# Patient Record
Sex: Female | Born: 1970 | Race: Black or African American | Hispanic: No | Marital: Single | Smoking: Current every day smoker
Health system: Southern US, Community
[De-identification: ages and names within clinical notes are randomized; demographics above are authoritative.]

## PROBLEM LIST (undated history)

## (undated) DIAGNOSIS — D219 Benign neoplasm of connective and other soft tissue, unspecified: Secondary | ICD-10-CM

## (undated) DIAGNOSIS — Z8619 Personal history of other infectious and parasitic diseases: Secondary | ICD-10-CM

## (undated) DIAGNOSIS — M25569 Pain in unspecified knee: Secondary | ICD-10-CM

## (undated) DIAGNOSIS — F329 Major depressive disorder, single episode, unspecified: Secondary | ICD-10-CM

## (undated) DIAGNOSIS — F1010 Alcohol abuse, uncomplicated: Secondary | ICD-10-CM

## (undated) DIAGNOSIS — R030 Elevated blood-pressure reading, without diagnosis of hypertension: Secondary | ICD-10-CM

## (undated) DIAGNOSIS — F1090 Alcohol use, unspecified, uncomplicated: Secondary | ICD-10-CM

## (undated) DIAGNOSIS — R32 Unspecified urinary incontinence: Secondary | ICD-10-CM

## (undated) DIAGNOSIS — Z7289 Other problems related to lifestyle: Secondary | ICD-10-CM

## (undated) DIAGNOSIS — IMO0001 Reserved for inherently not codable concepts without codable children: Secondary | ICD-10-CM

## (undated) DIAGNOSIS — F32A Depression, unspecified: Secondary | ICD-10-CM

## (undated) DIAGNOSIS — Z789 Other specified health status: Secondary | ICD-10-CM

## (undated) HISTORY — DX: Other problems related to lifestyle: Z72.89

## (undated) HISTORY — DX: Reserved for inherently not codable concepts without codable children: IMO0001

## (undated) HISTORY — DX: Personal history of other infectious and parasitic diseases: Z86.19

## (undated) HISTORY — DX: Depression, unspecified: F32.A

## (undated) HISTORY — DX: Major depressive disorder, single episode, unspecified: F32.9

## (undated) HISTORY — DX: Alcohol use, unspecified, uncomplicated: F10.90

## (undated) HISTORY — DX: Benign neoplasm of connective and other soft tissue, unspecified: D21.9

## (undated) HISTORY — DX: Pain in unspecified knee: M25.569

## (undated) HISTORY — DX: Elevated blood-pressure reading, without diagnosis of hypertension: R03.0

## (undated) HISTORY — DX: Unspecified urinary incontinence: R32

## (undated) HISTORY — DX: Other specified health status: Z78.9

---

## 1997-07-15 ENCOUNTER — Emergency Department (HOSPITAL_COMMUNITY): Admission: EM | Admit: 1997-07-15 | Discharge: 1997-07-15 | Payer: Self-pay | Admitting: Internal Medicine

## 1998-05-16 ENCOUNTER — Emergency Department (HOSPITAL_COMMUNITY): Admission: EM | Admit: 1998-05-16 | Discharge: 1998-05-16 | Payer: Self-pay | Admitting: Emergency Medicine

## 1998-08-01 ENCOUNTER — Emergency Department (HOSPITAL_COMMUNITY): Admission: EM | Admit: 1998-08-01 | Discharge: 1998-08-02 | Payer: Self-pay | Admitting: Emergency Medicine

## 1998-09-20 ENCOUNTER — Emergency Department (HOSPITAL_COMMUNITY): Admission: EM | Admit: 1998-09-20 | Discharge: 1998-09-20 | Payer: Self-pay | Admitting: Emergency Medicine

## 1998-09-20 ENCOUNTER — Encounter: Payer: Self-pay | Admitting: Emergency Medicine

## 2001-03-31 ENCOUNTER — Other Ambulatory Visit: Admission: RE | Admit: 2001-03-31 | Discharge: 2001-03-31 | Payer: Self-pay | Admitting: Obstetrics and Gynecology

## 2003-03-17 ENCOUNTER — Emergency Department (HOSPITAL_COMMUNITY): Admission: EM | Admit: 2003-03-17 | Discharge: 2003-03-17 | Payer: Self-pay | Admitting: Emergency Medicine

## 2006-09-29 ENCOUNTER — Emergency Department (HOSPITAL_COMMUNITY): Admission: EM | Admit: 2006-09-29 | Discharge: 2006-09-29 | Payer: Self-pay | Admitting: Emergency Medicine

## 2007-02-07 ENCOUNTER — Emergency Department (HOSPITAL_COMMUNITY): Admission: EM | Admit: 2007-02-07 | Discharge: 2007-02-07 | Payer: Self-pay | Admitting: Emergency Medicine

## 2007-02-12 ENCOUNTER — Emergency Department (HOSPITAL_COMMUNITY): Admission: EM | Admit: 2007-02-12 | Discharge: 2007-02-12 | Payer: Self-pay | Admitting: Emergency Medicine

## 2007-02-19 ENCOUNTER — Emergency Department (HOSPITAL_COMMUNITY): Admission: EM | Admit: 2007-02-19 | Discharge: 2007-02-19 | Payer: Self-pay | Admitting: Emergency Medicine

## 2008-05-19 ENCOUNTER — Emergency Department (HOSPITAL_COMMUNITY): Admission: EM | Admit: 2008-05-19 | Discharge: 2008-05-19 | Payer: Self-pay | Admitting: Emergency Medicine

## 2009-03-04 ENCOUNTER — Emergency Department (HOSPITAL_COMMUNITY): Admission: EM | Admit: 2009-03-04 | Discharge: 2009-03-04 | Payer: Self-pay | Admitting: Emergency Medicine

## 2009-04-20 IMAGING — CR DG CHEST 2V
2 series · 2 of 2 positions shown · non-contrast
Comparison: None.

CLINICAL DATA: Cough, wheezing and congestion.
 CHEST ? 2 VIEW:

[w chest pa]
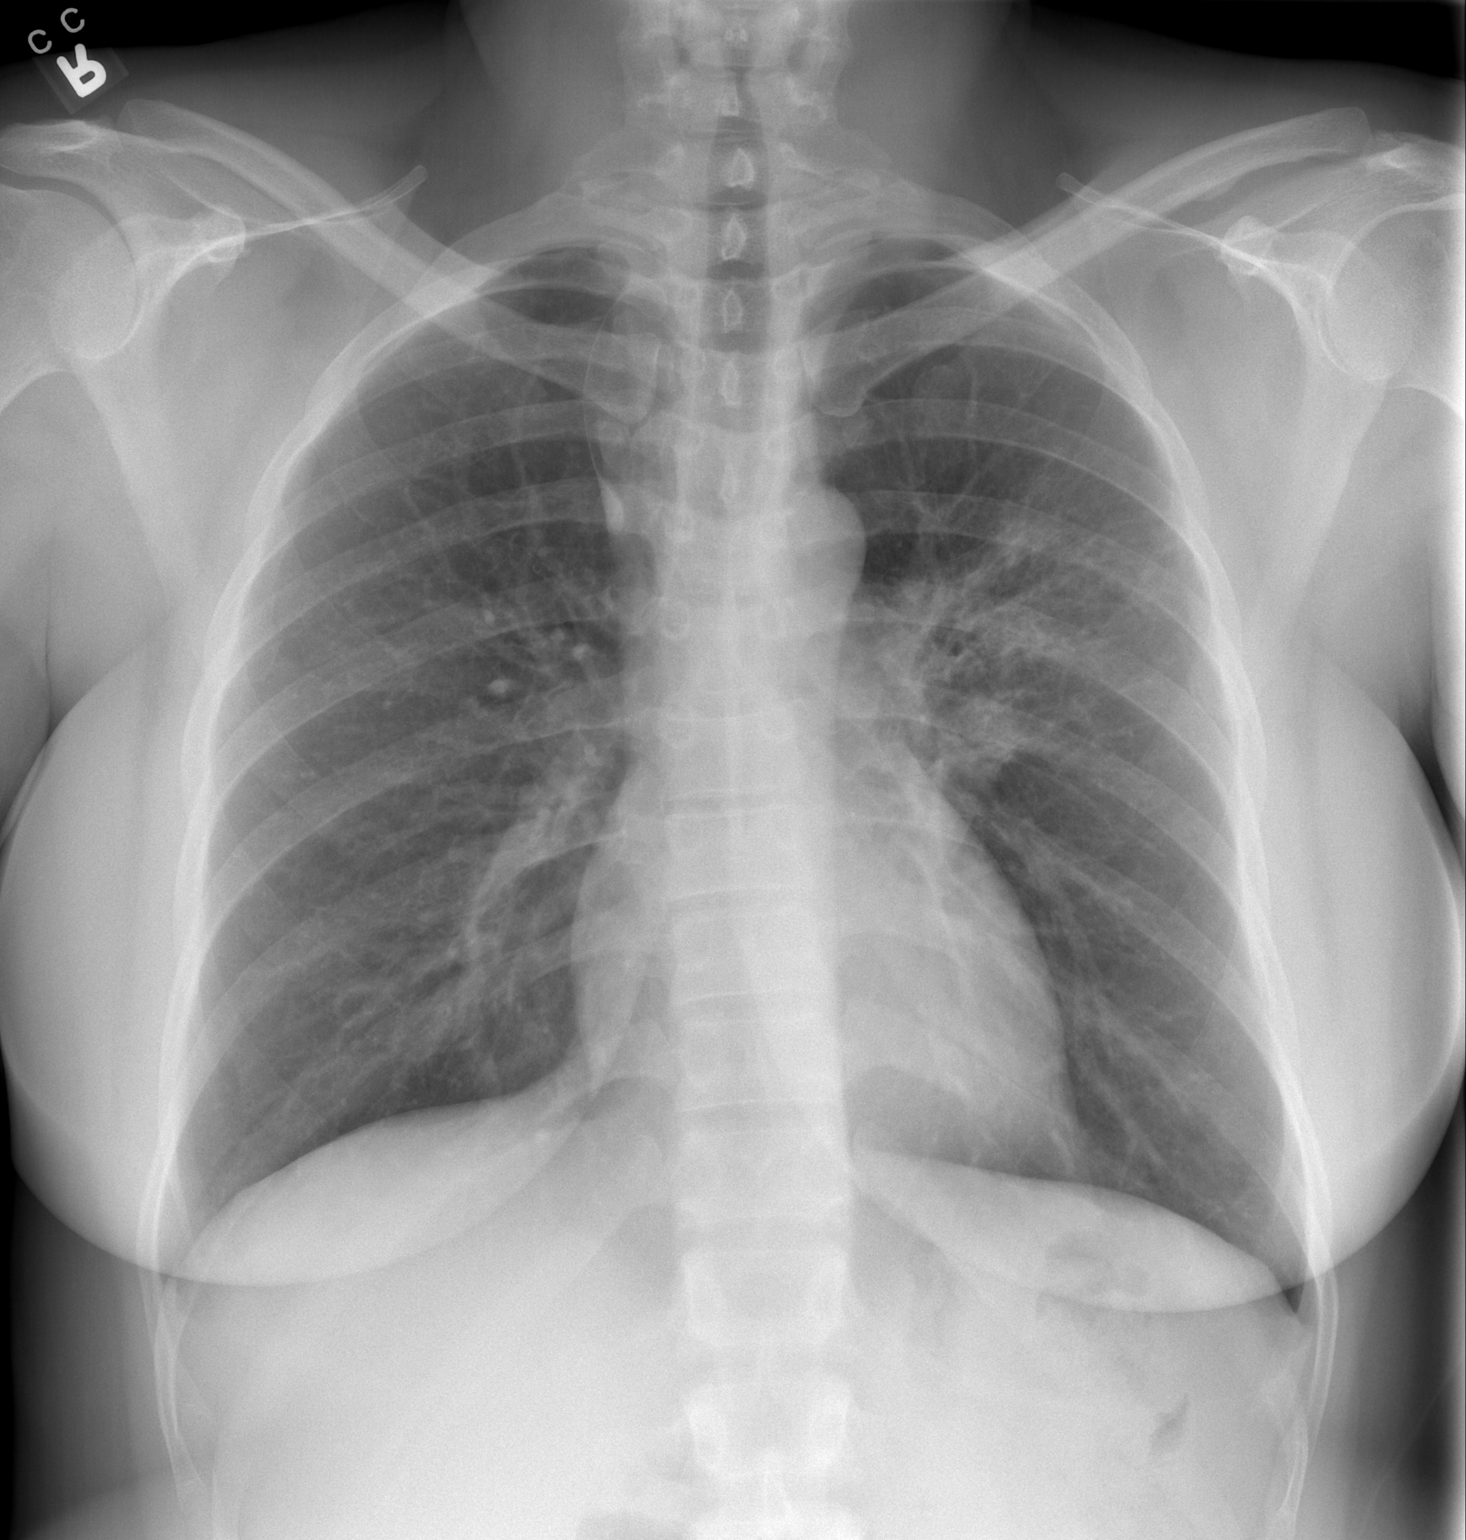

[w chest lat]
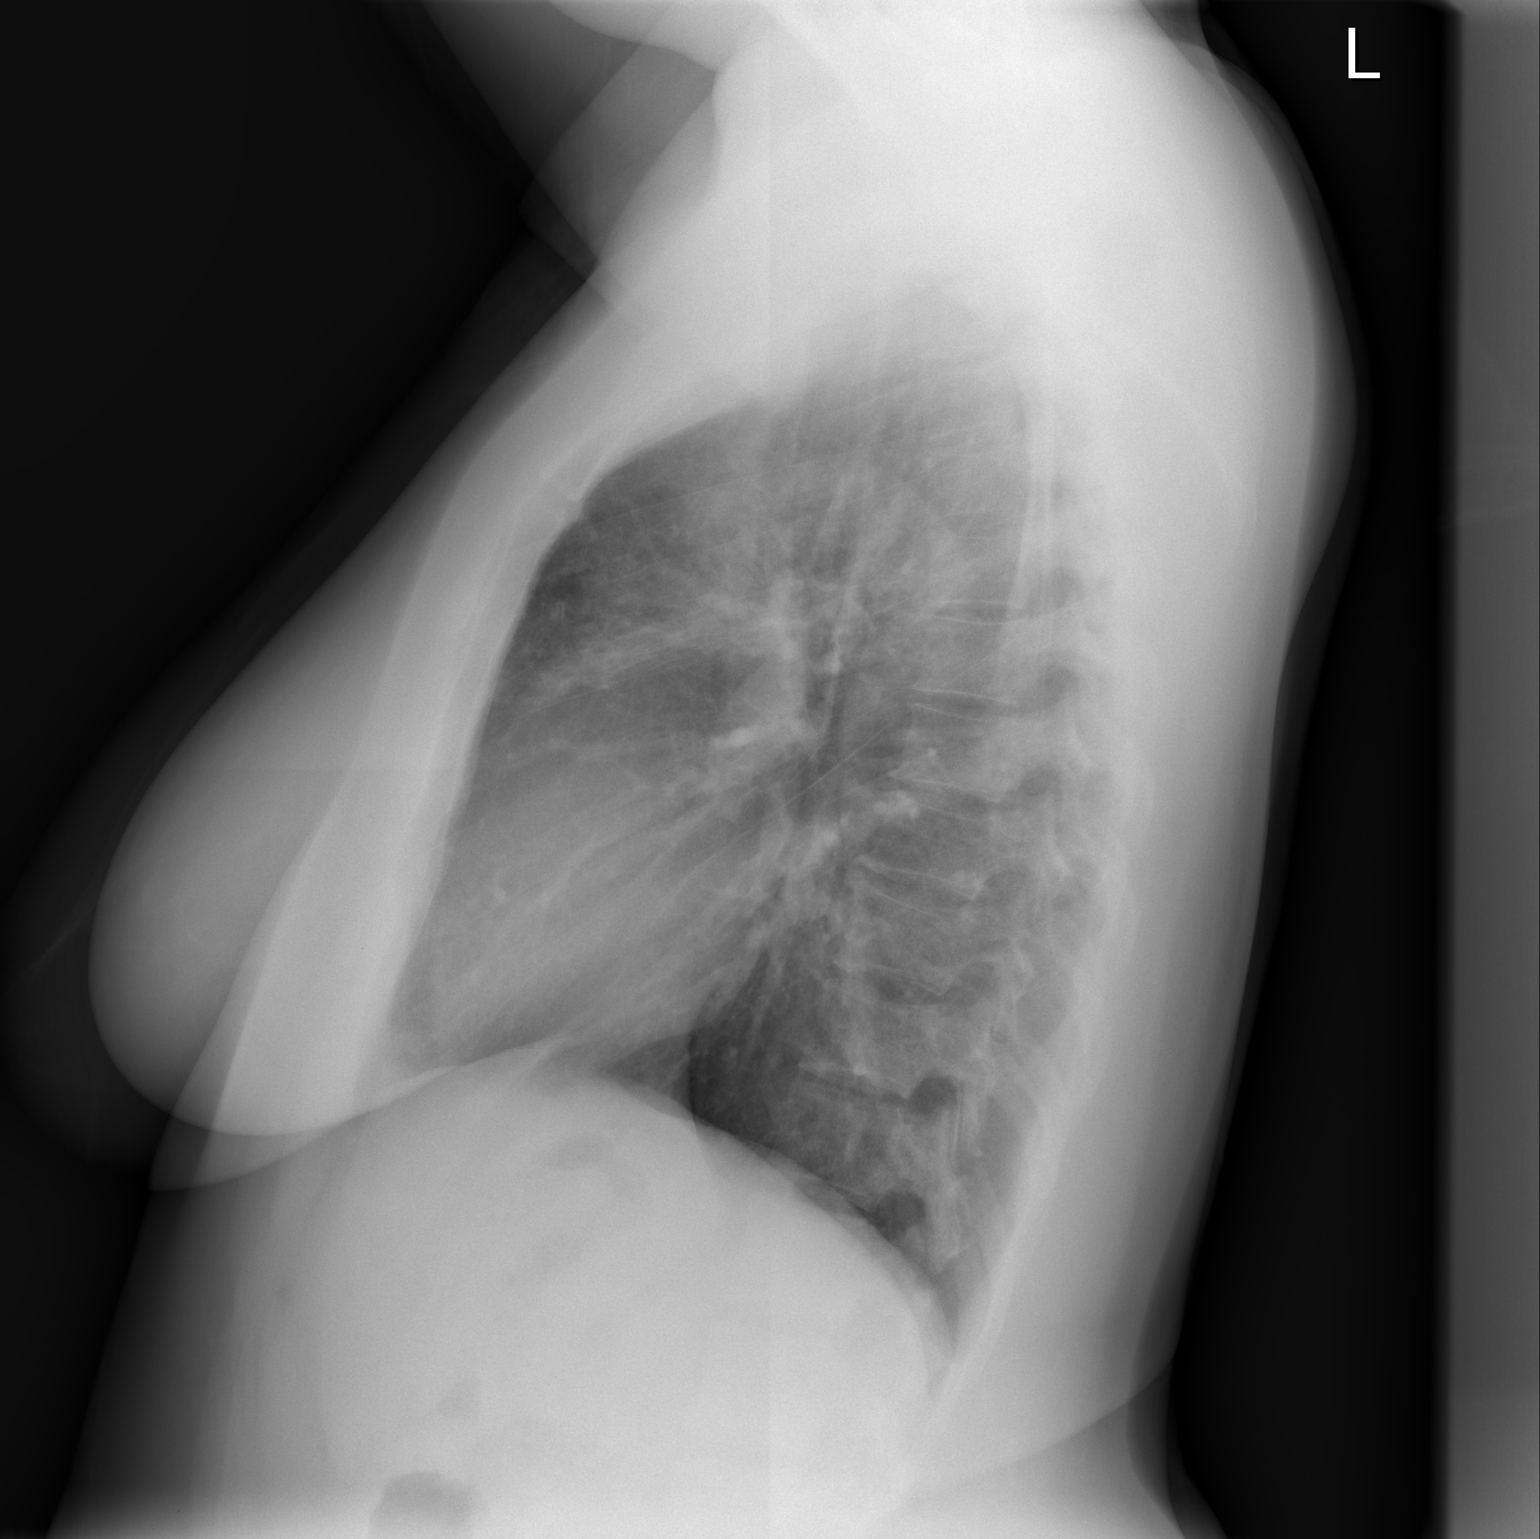

[2 of 2 positions shown; findings below may reference images not displayed]

FINDINGS: Two views of the chest show opacity within the anterior left upper lobe most consistent with left upper lobe pneumonia.  The remainder of the lungs is clear.  The heart is within normal limits in size.
IMPRESSION: Opacity in the left upper lobe consistent with pneumonia.

## 2009-10-24 ENCOUNTER — Encounter: Admission: RE | Admit: 2009-10-24 | Discharge: 2009-10-24 | Payer: Self-pay | Admitting: Gynecology

## 2009-10-25 ENCOUNTER — Encounter: Admission: RE | Admit: 2009-10-25 | Discharge: 2009-10-25 | Payer: Self-pay | Admitting: Gynecology

## 2010-07-09 ENCOUNTER — Emergency Department (HOSPITAL_COMMUNITY)
Admission: EM | Admit: 2010-07-09 | Discharge: 2010-07-09 | Disposition: A | Discharge status: Home / Self Care | Payer: BC Managed Care – PPO | Attending: Emergency Medicine | Admitting: Emergency Medicine

## 2010-07-09 DIAGNOSIS — R609 Edema, unspecified: Secondary | ICD-10-CM | POA: Insufficient documentation

## 2010-10-10 ENCOUNTER — Emergency Department (HOSPITAL_COMMUNITY)
Admission: EM | Admit: 2010-10-10 | Discharge: 2010-10-10 | Disposition: A | Discharge status: Home / Self Care | Payer: BC Managed Care – PPO | Attending: Emergency Medicine | Admitting: Emergency Medicine

## 2010-10-10 DIAGNOSIS — R490 Dysphonia: Secondary | ICD-10-CM | POA: Insufficient documentation

## 2010-10-10 DIAGNOSIS — R05 Cough: Secondary | ICD-10-CM | POA: Insufficient documentation

## 2010-10-10 DIAGNOSIS — J40 Bronchitis, not specified as acute or chronic: Secondary | ICD-10-CM | POA: Insufficient documentation

## 2010-10-10 DIAGNOSIS — R059 Cough, unspecified: Secondary | ICD-10-CM | POA: Insufficient documentation

## 2010-10-11 LAB — CBC
HCT: 39.2
Hemoglobin: 13.5
MCHC: 34.5
RDW: 13.6

## 2010-10-11 LAB — BASIC METABOLIC PANEL
BUN: 2 — ABNORMAL LOW
Chloride: 104
Glucose, Bld: 101 — ABNORMAL HIGH
Potassium: 3.5

## 2010-10-11 LAB — DIFFERENTIAL
Basophils Absolute: 0.1
Basophils Relative: 1
Eosinophils Relative: 3
Monocytes Absolute: 0.6

## 2010-10-18 ENCOUNTER — Emergency Department (HOSPITAL_COMMUNITY)
Admission: EM | Admit: 2010-10-18 | Discharge: 2010-10-18 | Disposition: A | Discharge status: Home / Self Care | Payer: BC Managed Care – PPO | Attending: Emergency Medicine | Admitting: Emergency Medicine

## 2010-10-18 ENCOUNTER — Emergency Department (HOSPITAL_COMMUNITY): Payer: BC Managed Care – PPO

## 2010-10-18 DIAGNOSIS — F17200 Nicotine dependence, unspecified, uncomplicated: Secondary | ICD-10-CM | POA: Insufficient documentation

## 2010-10-18 DIAGNOSIS — R059 Cough, unspecified: Secondary | ICD-10-CM | POA: Insufficient documentation

## 2010-10-18 DIAGNOSIS — R0989 Other specified symptoms and signs involving the circulatory and respiratory systems: Secondary | ICD-10-CM | POA: Insufficient documentation

## 2010-10-18 DIAGNOSIS — R05 Cough: Secondary | ICD-10-CM | POA: Insufficient documentation

## 2010-11-02 LAB — URINALYSIS, ROUTINE W REFLEX MICROSCOPIC
Bilirubin Urine: NEGATIVE
Ketones, ur: NEGATIVE
Nitrite: NEGATIVE
Urobilinogen, UA: 1
pH: 6.5

## 2010-11-02 LAB — D-DIMER, QUANTITATIVE

## 2011-02-26 ENCOUNTER — Encounter (HOSPITAL_COMMUNITY): Payer: Self-pay | Admitting: Family Medicine

## 2011-02-26 ENCOUNTER — Emergency Department (HOSPITAL_COMMUNITY): Payer: BC Managed Care – PPO

## 2011-02-26 ENCOUNTER — Emergency Department (HOSPITAL_COMMUNITY)
Admission: EM | Admit: 2011-02-26 | Discharge: 2011-02-26 | Disposition: A | Discharge status: Home / Self Care | Payer: BC Managed Care – PPO | Attending: Emergency Medicine | Admitting: Emergency Medicine

## 2011-02-26 DIAGNOSIS — F17200 Nicotine dependence, unspecified, uncomplicated: Secondary | ICD-10-CM | POA: Insufficient documentation

## 2011-02-26 DIAGNOSIS — R109 Unspecified abdominal pain: Secondary | ICD-10-CM | POA: Insufficient documentation

## 2011-02-26 DIAGNOSIS — O200 Threatened abortion: Secondary | ICD-10-CM | POA: Insufficient documentation

## 2011-02-26 LAB — DIFFERENTIAL
Eosinophils Absolute: 0.1 10*3/uL (ref 0.0–0.7)
Lymphocytes Relative: 26 % (ref 12–46)
Lymphs Abs: 2.1 10*3/uL (ref 0.7–4.0)
Monocytes Relative: 5 % (ref 3–12)
Neutrophils Relative %: 67 % (ref 43–77)

## 2011-02-26 LAB — CBC
Hemoglobin: 10.5 g/dL — ABNORMAL LOW (ref 12.0–15.0)
MCH: 25.7 pg — ABNORMAL LOW (ref 26.0–34.0)
RBC: 4.08 MIL/uL (ref 3.87–5.11)

## 2011-02-26 LAB — HCG, QUANTITATIVE, PREGNANCY: hCG, Beta Chain, Quant, S: 12376 m[IU]/mL — ABNORMAL HIGH (ref <5)

## 2011-02-26 LAB — BASIC METABOLIC PANEL
BUN: 3 mg/dL — ABNORMAL LOW (ref 6–23)
CO2: 23 mEq/L (ref 19–32)
Chloride: 103 mEq/L (ref 96–112)
GFR calc non Af Amer: >90 mL/min (ref >90)
Glucose, Bld: 108 mg/dL — ABNORMAL HIGH (ref 70–99)
Potassium: 3.1 mEq/L — ABNORMAL LOW (ref 3.5–5.1)

## 2011-02-26 LAB — URINALYSIS, ROUTINE W REFLEX MICROSCOPIC
Bilirubin Urine: NEGATIVE
Glucose, UA: NEGATIVE mg/dL
Specific Gravity, Urine: 1.013 (ref 1.005–1.030)
pH: 7 (ref 5.0–8.0)

## 2011-02-26 LAB — URINE MICROSCOPIC-ADD ON

## 2011-02-26 NOTE — ED Notes (Signed)
Pt reports having a large amount of vaginal bleeding with clots x2 days ago with spotting since then. Reports that she thinks she might be having a miscarriage.  LMP in December.  Reports abdominal pain all over. Reports some nausea.

## 2011-02-26 NOTE — ED Provider Notes (Signed)
History     CSN: [id number]  Arrival date & time 02/26/11  0850   First MD Initiated Contact with Patient 02/26/11 [phone]      Chief Complaint  Patient presents with  . Abdominal Pain  . Vaginal Bleeding    (Consider location/radiation/quality/duration/timing/severity/associated sxs/prior treatment) HPI Comments: Had an episode of vaginal bleeding on Saturday, continues to have some spotting.  No pain.    Patient is a 41 y.o. female presenting with abdominal pain and vaginal bleeding. The history is provided by the patient.  Abdominal Pain The primary symptoms of the illness include abdominal pain and vaginal bleeding. The primary symptoms of the illness do not include fever, fatigue, nausea, vomiting or diarrhea. The current episode started more than 2 days ago. The problem has been gradually improving.  The patient states that she believes she is currently pregnant. The patient has not had a change in bowel habit. Symptoms associated with the illness do not include constipation.  Vaginal Bleeding Associated symptoms include abdominal pain.    History reviewed. No pertinent past medical history.  History reviewed. No pertinent past surgical history.  History reviewed. No pertinent family history.  History  Substance Use Topics  . Smoking status: Current Everyday Smoker -- 1.0 packs/day  . Smokeless tobacco: Not on file  . Alcohol Use: Yes     occasionally    OB History    Grav Para Term Preterm Abortions TAB SAB Ect Mult Living                  Review of Systems  Constitutional: Negative for fever and fatigue.  Gastrointestinal: Positive for abdominal pain. Negative for nausea, vomiting, diarrhea and constipation.  Genitourinary: Positive for vaginal bleeding.  All other systems reviewed and are negative.    Allergies  Review of patient's allergies indicates no known allergies.  Home Medications  No current outpatient prescriptions on file.  BP 134/74  Pulse  97  Temp(Src) 99.1 F (37.3 C) (Oral)  Resp 18  Ht 5\' 5"  (1.651 m)  Wt 220 lb (99.791 kg)  BMI 36.61 kg/m2  SpO2 98%  LMP 01/15/2011  Physical Exam  Nursing note and vitals reviewed. Constitutional: She is oriented to person, place, and time. She appears well-developed and well-nourished. No distress.  HENT:  Head: Normocephalic and atraumatic.  Neck: Normal range of motion. Neck supple.  Cardiovascular: Normal rate and regular rhythm.  Exam reveals no gallop and no friction rub.   No murmur heard. Pulmonary/Chest: Effort normal and breath sounds normal. No respiratory distress. She has no wheezes.  Abdominal: Soft. Bowel sounds are normal. She exhibits no distension. There is no tenderness.  Musculoskeletal: Normal range of motion.  Neurological: She is alert and oriented to person, place, and time.  Skin: Skin is warm and dry. She is not diaphoretic.    ED Course  Procedures (including critical care time)  Labs Reviewed  CBC - Abnormal; Notable for the following:    Hemoglobin 10.5 (*)    HCT 32.6 (*)    MCH 25.7 (*)    RDW 16.4 (*)    All other components within normal limits  BASIC METABOLIC PANEL - Abnormal; Notable for the following:    Potassium 3.1 (*)    Glucose, Bld 108 (*)    BUN 3 (*)    All other components within normal limits  URINALYSIS, ROUTINE W REFLEX MICROSCOPIC - Abnormal; Notable for the following:    Hgb urine dipstick MODERATE (*)  All other components within normal limits  HCG, QUANTITATIVE, PREGNANCY - Abnormal; Notable for the following:    hCG, Beta Chain, Quant, S 16109 (*)    All other components within normal limits  URINE MICROSCOPIC-ADD ON - Abnormal; Notable for the following:    Squamous Epithelial / LPF FEW (*)    Bacteria, UA FEW (*)    All other components within normal limits  DIFFERENTIAL   US Ob Comp Less 14 Wks  02/26/2011  *RADIOLOGY REPORT*  Clinical Data: Vaginal bleeding  OBSTETRIC <14 WK [place] AND TRANSVAGINAL OB US   Technique:  Both transabdominal and transvaginal ultrasound examinations were performed for complete evaluation of the gestation as well as the maternal uterus, adnexal regions, and pelvic cul-de-sac.  Transvaginal technique was performed to assess early pregnancy.  Comparison:  None.  Intrauterine gestational sac:  Visualized/normal in shape. Yolk sac: Seen Embryo: Seen Cardiac Activity: Seen Heart Rate: 113 bpm  MSD:   mm      w     d CRL: 6.8   mm  6   w  4   d         [place] EDC: 10/18/2011  Maternal uterus/adnexae: A large focal fibroid is identified in the posterior upper uterine segment measuring 6.9 x 6.2 by 5.3 cm.  This has a partial submucosal component deviating the endometrial canal anteriorly.  The right ovary measures 3.8 x 2.6 by 2.5 cm and contains a corpus luteum.  The left ovary could be seen only transabdominally and has a normal transabdominal appearance measuring 3.6 x 1.9 x 2.2 cm.  IMPRESSION: Single living intrauterine pregnancy demonstrating an estimated gestational age by ultrasound of 6 weeks 4 days.  This correlates well with expected estimated gestational age by LMP of 6 weeks 1 day with corresponding EDC of 10/21/2011.  Focal fibroid with size and location as noted above.  Normal ovaries  Original Report Authenticated By: Bertha Stakes, M.D.   US Ob Transvaginal  02/26/2011  *RADIOLOGY REPORT*  Clinical Data: Vaginal bleeding  OBSTETRIC <14 WK [place] AND TRANSVAGINAL OB US  Technique:  Both transabdominal and transvaginal ultrasound examinations were performed for complete evaluation of the gestation as well as the maternal uterus, adnexal regions, and pelvic cul-de-sac.  Transvaginal technique was performed to assess early pregnancy.  Comparison:  None.  Intrauterine gestational sac:  Visualized/normal in shape. Yolk sac: Seen Embryo: Seen Cardiac Activity: Seen Heart Rate: 113 bpm  MSD:   mm      w     d CRL: 6.8   mm  6   w  4   d         [place] EDC: 10/18/2011  Maternal uterus/adnexae:  A large focal fibroid is identified in the posterior upper uterine segment measuring 6.9 x 6.2 by 5.3 cm.  This has a partial submucosal component deviating the endometrial canal anteriorly.  The right ovary measures 3.8 x 2.6 by 2.5 cm and contains a corpus luteum.  The left ovary could be seen only transabdominally and has a normal transabdominal appearance measuring 3.6 x 1.9 x 2.2 cm.  IMPRESSION: Single living intrauterine pregnancy demonstrating an estimated gestational age by ultrasound of 6 weeks 4 days.  This correlates well with expected estimated gestational age by LMP of 6 weeks 1 day with corresponding EDC of 10/21/2011.  Focal fibroid with size and location as noted above.  Normal ovaries  Original Report Authenticated By: Bertha Stakes, M.D.  No diagnosis found.    MDM  Labs show Bhcg of 12k.  The US shows an iup with fetal heart beat.  Will discharge with follow up with OB.        Geoffery Lyons, MD 02/26/11 [phone]

## 2014-01-25 ENCOUNTER — Emergency Department (HOSPITAL_COMMUNITY): Payer: BLUE CROSS/BLUE SHIELD

## 2014-01-25 ENCOUNTER — Encounter (HOSPITAL_COMMUNITY): Payer: Self-pay | Admitting: Emergency Medicine

## 2014-01-25 ENCOUNTER — Emergency Department (HOSPITAL_COMMUNITY)
Admission: EM | Admit: 2014-01-25 | Discharge: 2014-01-25 | Disposition: A | Discharge status: Home / Self Care | Payer: BLUE CROSS/BLUE SHIELD | Attending: Emergency Medicine | Admitting: Emergency Medicine

## 2014-01-25 DIAGNOSIS — J209 Acute bronchitis, unspecified: Secondary | ICD-10-CM | POA: Diagnosis not present

## 2014-01-25 DIAGNOSIS — Z720 Tobacco use: Secondary | ICD-10-CM | POA: Diagnosis not present

## 2014-01-25 DIAGNOSIS — R112 Nausea with vomiting, unspecified: Secondary | ICD-10-CM | POA: Insufficient documentation

## 2014-01-25 DIAGNOSIS — R109 Unspecified abdominal pain: Secondary | ICD-10-CM | POA: Diagnosis not present

## 2014-01-25 DIAGNOSIS — R531 Weakness: Secondary | ICD-10-CM | POA: Insufficient documentation

## 2014-01-25 DIAGNOSIS — Z3202 Encounter for pregnancy test, result negative: Secondary | ICD-10-CM | POA: Insufficient documentation

## 2014-01-25 DIAGNOSIS — R05 Cough: Secondary | ICD-10-CM

## 2014-01-25 DIAGNOSIS — J40 Bronchitis, not specified as acute or chronic: Secondary | ICD-10-CM

## 2014-01-25 DIAGNOSIS — R059 Cough, unspecified: Secondary | ICD-10-CM

## 2014-01-25 LAB — COMPREHENSIVE METABOLIC PANEL
ALT: 33 U/L (ref 0–35)
AST: 34 U/L (ref 0–37)
Albumin: 3.9 g/dL (ref 3.5–5.2)
Alkaline Phosphatase: 92 U/L (ref 39–117)
Anion gap: 6 (ref 5–15)
BUN: 6 mg/dL (ref 6–23)
CO2: 24 mmol/L (ref 19–32)
Calcium: 8.4 mg/dL (ref 8.4–10.5)
Chloride: 106 mEq/L (ref 96–112)
Creatinine, Ser: 0.62 mg/dL (ref 0.50–1.10)
GFR calc Af Amer: >90 mL/min (ref >90)
GFR calc non Af Amer: >90 mL/min (ref >90)
Glucose, Bld: 107 mg/dL — ABNORMAL HIGH (ref 70–99)
Potassium: 3.4 mmol/L — ABNORMAL LOW (ref 3.5–5.1)
Sodium: 136 mmol/L (ref 135–145)
Total Bilirubin: 0.3 mg/dL (ref 0.3–1.2)
Total Protein: 7.6 g/dL (ref 6.0–8.3)

## 2014-01-25 LAB — URINALYSIS, ROUTINE W REFLEX MICROSCOPIC
Glucose, UA: NEGATIVE mg/dL
Ketones, ur: NEGATIVE mg/dL
Leukocytes, UA: NEGATIVE
Nitrite: NEGATIVE
Protein, ur: NEGATIVE mg/dL
Specific Gravity, Urine: 1.028 (ref 1.005–1.030)
Urobilinogen, UA: 1 mg/dL (ref 0.0–1.0)
pH: 6 (ref 5.0–8.0)

## 2014-01-25 LAB — CBC WITH DIFFERENTIAL/PLATELET
BASOS ABS: 0 10*3/uL (ref 0.0–0.1)
BASOS PCT: 0 % (ref 0–1)
EOS PCT: 2 % (ref 0–5)
Eosinophils Absolute: 0.2 10*3/uL (ref 0.0–0.7)
HCT: 36.1 % (ref 36.0–46.0)
Hemoglobin: 11.4 g/dL — ABNORMAL LOW (ref 12.0–15.0)
LYMPHS PCT: 34 % (ref 12–46)
Lymphs Abs: 2.4 10*3/uL (ref 0.7–4.0)
MCH: 27.3 pg (ref 26.0–34.0)
MCHC: 31.6 g/dL (ref 30.0–36.0)
MCV: 86.6 fL (ref 78.0–100.0)
MONO ABS: 0.5 10*3/uL (ref 0.1–1.0)
Monocytes Relative: 7 % (ref 3–12)
Neutro Abs: 4.2 10*3/uL (ref 1.7–7.7)
Neutrophils Relative %: 57 % (ref 43–77)
Platelets: 402 10*3/uL — ABNORMAL HIGH (ref 150–400)
RBC: 4.17 MIL/uL (ref 3.87–5.11)
RDW: 15.8 % — AB (ref 11.5–15.5)
WBC: 7.3 10*3/uL (ref 4.0–10.5)

## 2014-01-25 LAB — LIPASE, BLOOD: Lipase: 24 U/L (ref 11–59)

## 2014-01-25 LAB — POC URINE PREG, ED: Preg Test, Ur: NEGATIVE

## 2014-01-25 LAB — URINE MICROSCOPIC-ADD ON

## 2014-01-25 MED ORDER — DOXYCYCLINE HYCLATE 100 MG PO CAPS: 100.0000 mg | ORAL_CAPSULE | Freq: Two times a day (BID) | ORAL | Status: DC

## 2014-01-25 MED ORDER — HYDROCODONE-ACETAMINOPHEN 5-325 MG PO TABS
2.0000 | ORAL_TABLET | Freq: Once | ORAL | Status: AC
  Administered 2014-01-25: 2 via ORAL
  Filled 2014-01-25: qty 2

## 2014-01-25 MED ORDER — BENZONATATE 100 MG PO CAPS
100.0000 mg | ORAL_CAPSULE | Freq: Once | ORAL | Status: AC
  Administered 2014-01-25: 100 mg via ORAL
  Filled 2014-01-25: qty 1

## 2014-01-25 MED ORDER — BENZONATATE 100 MG PO CAPS: 100.0000 mg | ORAL_CAPSULE | Freq: Two times a day (BID) | ORAL | Status: DC | PRN

## 2014-01-25 NOTE — ED Notes (Addendum)
Pt states that she has been having cold s/s including cough, congestion, HA, sore throat, lethargy, and generalized body pain since 12/20. Pt states that she is now also having a cramping type ABD pain in her RLQ. Pt denies any dysuria or unusual discharge.

## 2014-01-25 NOTE — ED Provider Notes (Signed)
CSN: [id number]     Arrival date & time 01/25/14  2030 History   First MD Initiated Contact with Patient 01/25/14 2120     Chief Complaint  Patient presents with  . Cough  . Abdominal Pain     (Consider location/radiation/quality/duration/timing/severity/associated sxs/prior Treatment) Patient is a 44 y.o. female presenting with cough and abdominal pain. The history is provided by the patient.  Cough Cough characteristics:  Non-productive Severity:  Moderate Onset quality:  Gradual Timing:  Constant Progression:  Unchanged Chronicity:  New Smoker: no   Context: not sick contacts   Relieved by:  Nothing Worsened by:  Nothing tried Associated symptoms: no chest pain, no chills, no fever and no shortness of breath   Abdominal Pain Associated symptoms: nausea (post-tussive) and vomiting (post-tussive)   Associated symptoms: no chest pain, no chills, no cough, no fever and no shortness of breath     History reviewed. No pertinent past medical history. History reviewed. No pertinent past surgical history. No family history on file. History  Substance Use Topics  . Smoking status: Current Every Day Smoker -- 1.00 packs/day  . Smokeless tobacco: Never Used  . Alcohol Use: Yes     Comment: pt states she drinks 1/2 a fifth of liquor a day   OB History    No data available     Review of Systems  Constitutional: Negative for fever and chills.  Respiratory: Negative for cough and shortness of breath.   Cardiovascular: Negative for chest pain and leg swelling.  Gastrointestinal: Positive for nausea (post-tussive) and vomiting (post-tussive). Negative for abdominal pain.  Neurological: Positive for weakness (generalized).  All other systems reviewed and are negative.     Allergies  Review of patient's allergies indicates no known allergies.  Home Medications   Prior to Admission medications   Medication Sig Start Date End Date Taking? Authorizing Provider   DM-Phenylephrine-Acetaminophen (ALKA-SELTZER PLS SINUS & COUGH) 10-5-325 MG CAPS Take 1 capsule by mouth 2 (two) times daily as needed (cough/cold).   Yes Historical Provider, MD   BP 147/96 mmHg  Pulse 101  Temp(Src) 99.2 F (37.3 C) (Oral)  Resp 18  Ht 5\' 5"  (1.651 m)  Wt 262 lb 4.8 oz (118.978 kg)  BMI 43.65 kg/m2  SpO2 100%  LMP 01/17/2014 (Approximate) Physical Exam  Constitutional: She is oriented to person, place, and time. She appears well-developed and well-nourished. No distress.  HENT:  Head: Normocephalic and atraumatic.  Mouth/Throat: Oropharynx is clear and moist.  Eyes: EOM are normal. Pupils are equal, round, and reactive to light.  Neck: Normal range of motion. Neck supple.  Cardiovascular: Normal rate and regular rhythm.  Exam reveals no friction rub.   No murmur heard. Pulmonary/Chest: Effort normal and breath sounds normal. No respiratory distress. She has no wheezes. She has no rales.  Abdominal: Soft. She exhibits no distension. There is no tenderness. There is no rebound.  Musculoskeletal: Normal range of motion. She exhibits no edema.  Neurological: She is alert and oriented to person, place, and time. No cranial nerve deficit. She exhibits normal muscle tone. Coordination normal.  Skin: No rash noted. She is not diaphoretic.  Nursing note and vitals reviewed.   ED Course  Procedures (including critical care time) Labs Review Labs Reviewed  CBC WITH DIFFERENTIAL - Abnormal; Notable for the following:    Hemoglobin 11.4 (*)    RDW 15.8 (*)    Platelets 402 (*)    All other components within normal limits  COMPREHENSIVE  METABOLIC PANEL  LIPASE, BLOOD  URINALYSIS, ROUTINE W REFLEX MICROSCOPIC  POC URINE PREG, ED    Imaging Review Dg Chest 2 View  01/25/2014   CLINICAL DATA:  Cold symptoms with cough, congestion, headache, sore throat, lethargy, and generalized body pain since 12/2013. Now also with cramping abdominal pain in the right lower  quadrant.  EXAM: CHEST  2 VIEW  COMPARISON:  10/18/2010  FINDINGS: The heart size and mediastinal contours are within normal limits. Both lungs are clear. The visualized skeletal structures are unremarkable. Azygos lobe.  IMPRESSION: No active cardiopulmonary disease.   Electronically Signed   By: Lucienne Capers M.D.   On: 01/25/2014 22:44     EKG Interpretation None      MDM   Final diagnoses:  Cough  Bronchitis    19F smoker here with cough, weakness. Dry, nonproductive cough lately. Reported abdominal pain, but states it's all related to her coughing. Post-tussive emesis noted also. Patient's lungs clear. CXR negative. Treated for bronchitis. I offered lab work and fluids but she refused.    Evelina Bucy, MD 01/25/14 [phone]

## 2014-03-22 ENCOUNTER — Emergency Department (HOSPITAL_COMMUNITY)
Admission: EM | Admit: 2014-03-22 | Discharge: 2014-03-22 | Disposition: A | Discharge status: Home / Self Care | Payer: BLUE CROSS/BLUE SHIELD | Attending: Emergency Medicine | Admitting: Emergency Medicine

## 2014-03-22 ENCOUNTER — Encounter (HOSPITAL_COMMUNITY): Payer: Self-pay | Admitting: Emergency Medicine

## 2014-03-22 DIAGNOSIS — Z79899 Other long term (current) drug therapy: Secondary | ICD-10-CM | POA: Insufficient documentation

## 2014-03-22 DIAGNOSIS — Z792 Long term (current) use of antibiotics: Secondary | ICD-10-CM | POA: Insufficient documentation

## 2014-03-22 DIAGNOSIS — Y998 Other external cause status: Secondary | ICD-10-CM | POA: Insufficient documentation

## 2014-03-22 DIAGNOSIS — Y9289 Other specified places as the place of occurrence of the external cause: Secondary | ICD-10-CM | POA: Insufficient documentation

## 2014-03-22 DIAGNOSIS — H60392 Other infective otitis externa, left ear: Secondary | ICD-10-CM

## 2014-03-22 DIAGNOSIS — Y9389 Activity, other specified: Secondary | ICD-10-CM | POA: Diagnosis not present

## 2014-03-22 DIAGNOSIS — S01302A Unspecified open wound of left ear, initial encounter: Secondary | ICD-10-CM | POA: Insufficient documentation

## 2014-03-22 DIAGNOSIS — L089 Local infection of the skin and subcutaneous tissue, unspecified: Secondary | ICD-10-CM | POA: Diagnosis not present

## 2014-03-22 DIAGNOSIS — Z720 Tobacco use: Secondary | ICD-10-CM | POA: Insufficient documentation

## 2014-03-22 DIAGNOSIS — H938X2 Other specified disorders of left ear: Secondary | ICD-10-CM | POA: Diagnosis present

## 2014-03-22 DIAGNOSIS — X58XXXA Exposure to other specified factors, initial encounter: Secondary | ICD-10-CM | POA: Diagnosis not present

## 2014-03-22 MED ORDER — DOXYCYCLINE HYCLATE 100 MG PO CAPS: 100.0000 mg | ORAL_CAPSULE | Freq: Two times a day (BID) | ORAL | Status: DC

## 2014-03-22 MED ORDER — MUPIROCIN CALCIUM 2 % EX CREA: 1.0000 "application " | TOPICAL_CREAM | Freq: Two times a day (BID) | CUTANEOUS | Status: DC

## 2014-03-22 NOTE — ED Provider Notes (Signed)
CSN: [id number]     Arrival date & time 03/22/14  1734 History  This chart was scribed for non-physician practitioner, Glendell Docker, NP working with Quintella Reichert, MD by Frederich Balding, ED scribe. This patient was seen in room WTR8/WTR8 and the patient's care was started at 5:53 PM.   Chief Complaint  Patient presents with  . Ear Problem   The history is provided by the patient. No language interpreter was used.    HPI Comments: Jean Perry is a 44 y.o. female who presents to the Emergency Department complaining of a wound to the cartilage of her left ear that started 2 weeks ago. The area has scabbed over but she thinks it might be getting infected. She states there is also a small bump to the back of her ear. Pt has use lotrimin with no relief. She denies fever.  No past medical history on file. No past surgical history on file. No family history on file. History  Substance Use Topics  . Smoking status: Current Every Day Smoker -- 1.00 packs/day  . Smokeless tobacco: Never Used  . Alcohol Use: Yes     Comment: pt states she drinks 1/2 a fifth of liquor a day   OB History    No data available     Review of Systems  Constitutional: Negative for fever.  Skin: Positive for wound.  All other systems reviewed and are negative.  Allergies  Review of patient's allergies indicates no known allergies.  Home Medications   Prior to Admission medications   Medication Sig Start Date End Date Taking? Authorizing Provider  benzonatate (TESSALON) 100 MG capsule Take 1 capsule (100 mg total) by mouth 2 (two) times daily as needed for cough. 01/25/14   Evelina Bucy, MD  DM-Phenylephrine-Acetaminophen (ALKA-SELTZER PLS SINUS & COUGH) 10-5-325 MG CAPS Take 1 capsule by mouth 2 (two) times daily as needed (cough/cold).    Historical Provider, MD  doxycycline (VIBRAMYCIN) 100 MG capsule Take 1 capsule (100 mg total) by mouth 2 (two) times daily. One po bid x 7 days 01/25/14   Evelina Bucy, MD    There were no vitals taken for this visit.   Physical Exam  Constitutional: She is oriented to person, place, and time. She appears well-developed and well-nourished. No distress.  HENT:  Head: Normocephalic and atraumatic.  Right Ear: External ear normal.  Wound noted to upper inner left ear. Small open wound noted. No redness or warmth noted to the area  Eyes: Conjunctivae and EOM are normal.  Neck: Neck supple. No tracheal deviation present.  Cardiovascular: Normal rate, regular rhythm and normal heart sounds.   Pulmonary/Chest: Effort normal and breath sounds normal. No respiratory distress.  Musculoskeletal: Normal range of motion.  Neurological: She is alert and oriented to person, place, and time.  Skin: Skin is warm and dry.  Psychiatric: She has a normal mood and affect. Her behavior is normal.  Nursing note and vitals reviewed.   ED Course  Procedures (including critical care time)  COORDINATION OF CARE: 5:55 PM-Discussed treatment plan which includes topical and oral antibiotics with pt at bedside and pt agreed to plan.   Labs Review Labs Reviewed - No data to display  Imaging Review No results found.   EKG Interpretation None      MDM   Final diagnoses:  Skin of left earlobe with infection    Pt sent home with doxycycline and bactroban for wound in the upper ear. No cellulitis noted at this  time  I personally performed the services described in this documentation, which was scribed in my presence. The recorded information has been reviewed and is accurate.   Glendell Docker, NP 03/22/14 2022  Quintella Reichert, MD 03/22/14 2024

## 2014-03-22 NOTE — ED Notes (Signed)
L/ear pain and rash on inside and outer ear x 2 week.

## 2014-03-22 NOTE — ED Notes (Signed)
Per pt, states left pain for over 2 weeks

## 2014-03-22 NOTE — Discharge Instructions (Signed)
Follow up with your doctor for continued or worsening symptoms Abscess An abscess is an infected area that contains a collection of pus and debris.It can occur in almost any part of the body. An abscess is also known as a furuncle or boil. CAUSES  An abscess occurs when tissue gets infected. This can occur from blockage of oil or sweat glands, infection of hair follicles, or a minor injury to the skin. As the body tries to fight the infection, pus collects in the area and creates pressure under the skin. This pressure causes pain. People with weakened immune systems have difficulty fighting infections and get certain abscesses more often.  SYMPTOMS Usually an abscess develops on the skin and becomes a painful mass that is red, warm, and tender. If the abscess forms under the skin, you may feel a moveable soft area under the skin. Some abscesses break open (rupture) on their own, but most will continue to get worse without care. The infection can spread deeper into the body and eventually into the bloodstream, causing you to feel ill.  DIAGNOSIS  Your caregiver will take your medical history and perform a physical exam. A sample of fluid may also be taken from the abscess to determine what is causing your infection. TREATMENT  Your caregiver may prescribe antibiotic medicines to fight the infection. However, taking antibiotics alone usually does not cure an abscess. Your caregiver may need to make a small cut (incision) in the abscess to drain the pus. In some cases, gauze is packed into the abscess to reduce pain and to continue draining the area. HOME CARE INSTRUCTIONS   Only take over-the-counter or prescription medicines for pain, discomfort, or fever as directed by your caregiver.  If you were prescribed antibiotics, take them as directed. Finish them even if you start to feel better.  If gauze is used, follow your caregiver's directions for changing the gauze.  To avoid spreading the  infection:  Keep your draining abscess covered with a bandage.  Wash your hands well.  Do not share personal care items, towels, or whirlpools with others.  Avoid skin contact with others.  Keep your skin and clothes clean around the abscess.  Keep all follow-up appointments as directed by your caregiver. SEEK MEDICAL CARE IF:   You have increased pain, swelling, redness, fluid drainage, or bleeding.  You have muscle aches, chills, or a general ill feeling.  You have a fever. MAKE SURE YOU:   Understand these instructions.  Will watch your condition.  Will get help right away if you are not doing well or get worse. Document Released: 10/17/2004 Document Revised: 07/09/2011 Document Reviewed: 03/22/2011 [facility] Patient Information 2015 [place], [place]. This information is not intended to replace advice given to you by your health care provider. Make sure you discuss any questions you have with your health care provider.

## 2014-04-22 ENCOUNTER — Ambulatory Visit (INDEPENDENT_AMBULATORY_CARE_PROVIDER_SITE_OTHER): Payer: BLUE CROSS/BLUE SHIELD | Admitting: Family Medicine

## 2014-04-22 ENCOUNTER — Telehealth: Payer: Self-pay | Admitting: Family Medicine

## 2014-04-22 ENCOUNTER — Encounter: Payer: Self-pay | Admitting: Family Medicine

## 2014-04-22 VITALS — BP 136/88 | HR 97 | Temp 98.4°F | Ht 65.0 in | Wt 264.7 lb

## 2014-04-22 DIAGNOSIS — Z7689 Persons encountering health services in other specified circumstances: Secondary | ICD-10-CM

## 2014-04-22 DIAGNOSIS — F329 Major depressive disorder, single episode, unspecified: Secondary | ICD-10-CM

## 2014-04-22 DIAGNOSIS — F32A Depression, unspecified: Secondary | ICD-10-CM | POA: Insufficient documentation

## 2014-04-22 DIAGNOSIS — M25562 Pain in left knee: Secondary | ICD-10-CM | POA: Diagnosis not present

## 2014-04-22 DIAGNOSIS — F1010 Alcohol abuse, uncomplicated: Secondary | ICD-10-CM

## 2014-04-22 DIAGNOSIS — M25569 Pain in unspecified knee: Secondary | ICD-10-CM | POA: Insufficient documentation

## 2014-04-22 DIAGNOSIS — Z7189 Other specified counseling: Secondary | ICD-10-CM

## 2014-04-22 DIAGNOSIS — L989 Disorder of the skin and subcutaneous tissue, unspecified: Secondary | ICD-10-CM

## 2014-04-22 MED ORDER — TRIAMCINOLONE ACETONIDE 0.1 % EX CREA: 1.0000 "application " | TOPICAL_CREAM | Freq: Two times a day (BID) | CUTANEOUS | Status: DC

## 2014-04-22 NOTE — Patient Instructions (Addendum)
BEFORE YOU LEAVE: -schedule physical in 3 month -xray sheet -patellafem syndrome exercises  For the skin problem on your ear: -use the cream twice dialy for 1 week -if not resolving in 2 weeks call for an appointment with the dermatologist for removal/biopsy  For the knee pain: -do the exercises provided 4 days per week and get the xrays  Please get help for the alcohol and depression - call to schedule appointment with a psychiatrist and consider [facility] or another alcohol rehab program.  We recommend the following healthy lifestyle measures: - eat a healthy diet consisting of lots of vegetables, fruits, beans, nuts, seeds, healthy meats such as white chicken and fish and whole grains.  - avoid fried foods, fast food, processed foods, sodas, red meet and other fattening foods.  - get a least 150 minutes of aerobic exercise per week.

## 2014-04-22 NOTE — Progress Notes (Signed)
HPI:  Jean Perry is here to establish care. She reports she has not seen a doctor in a long time. She is out of date on all of her preventive care. She reports she had an outer ear infection on the skin of her outer ear recently and had to go to the ED for this.Wants [place] to recheck this.  Depression: -has struggled with this for a long time (many years) with a low grade depressed mood all the time -has been on medications in the past but never really was able to follow up -she has struggled more the last few years since her son came back from the war and has health issues, and she now lives alone as her children are grown -drinks heavy on a frequent basis - 5th of whiskey daily -she has not sought help for the alcohol abuse recently -denies SI or thoughts of self harm, hospitalization in the past -denies IV drug use or recreational drug use  Elevated Blood Pressure: -she reports she has not ever had blood pressure issues -denies: CP, SOB, DOE  Bilateral knee pain: -reports twisted L knee 2 years ago -moderate to severe, shar pain in both knees for many years but L >R, worse when getting up and down -better with rest -denies: falls, popping, clicking, giving away  Skin lesion on ear: -for 1 month -crusty skin on L helix -denies itching or bleeding  Alcohol abuse: -reports heavy drinking for many years -she reports she wants to get help, then in the next sentence says she does not -she denies any IV drug use, hx of other drug use, passing out, or dependence - reports she can stop drinking for periods of time without withdrawal symptoms  Obesity: -no regular exercise, poor diet  ROS negative for unless reported above: fevers, unintentional weight loss, hearing or vision loss, chest pain, palpitations, struggling to breath, hemoptysis, melena, hematochezia, hematuria, falls, loc, si, thoughts of self harm  Past Medical History  Diagnosis Date  . Depression   . History  of chicken pox   . Urine incontinence   . Alcohol use   . Elevated blood pressure   . Knee pain   . Fibroids     History reviewed. No pertinent past surgical history.  Family History  Problem Relation Age of Onset  . Alcoholism Mother   . Alcoholism Father   . Lung cancer Maternal Grandfather     History   Social History  . Marital Status: Single    Spouse Name: N/A  . Number of Children: N/A  . Years of Education: N/A   Social History Main Topics  . Smoking status: Current Every Day Smoker -- 1.00 packs/day  . Smokeless tobacco: Never Used  . Alcohol Use: Yes     Comment: pt states she drinks 1/2 a fifth of liquor a day  . Drug Use: No  . Sexual Activity: Not on file   Other Topics Concern  . None   Social History Narrative   Work or School: full Copy Situation: lives alone      Spiritual Beliefs: Christian      Lifestyle: no exercise, not eating well           Current outpatient prescriptions:  .  triamcinolone cream (KENALOG) 0.1 %, Apply 1 application topically 2 (two) times daily., Disp: 30 g, Rfl: 0  EXAM:  Filed Vitals:   04/22/14 1439  BP: 136/88  Pulse: 97  Temp: 98.4 F (36.9 C)    Body mass index is 44.05 kg/(m^2).  GENERAL: vitals reviewed and listed above, alert, oriented, appears well hydrated and in no acute distress  HEENT: atraumatic, conjunttiva clear, no obvious abnormalities on inspection of external nose and ears  NECK: no obvious masses on inspection  LUNGS: clear to auscultation bilaterally, no wheezes, rales or rhonchi, good air movement  CV: HRRR, no peripheral edema  MS: moves all extremities without noticeable abnormality Normal inspection of both knees without deformity or appreciable effusion of knees Patella crepitus and L J sign L No TTP other then at lat patella Neg lachman, neg drawer, neg valgus/var stress, neg mcmurry NV intact distal bilat  Skin: crusting scally mildly  eroded area on L ear  PSYCH: pleasant and cooperative, no obvious depression or anxiety  ASSESSMENT AND PLAN:  Discussed the following assessment and plan:  Lateral knee pain, left - Plan: DG Knee Complete 4 Views Left -suspect infrapat OA and PFS -plain films -HEP -follow up in 1-3 months  Depression Alcohol abuse -advised alcohol rehab and information provided -advised given dual diagnosis that she seek care with a psychiatrist and information, numbers given to call, ED precuations  Skin lesion -may be seb derm and irritated from picking, but voiced concern that could also be skin cancer - advised if not resolved with top steroid to seek care with dermatologist for biopsy. Number provided to call.  Morbid obesity -lifestyle recs, advised weight loss would likely help with knee pain  Encounter to establish care  -We reviewed the PMH, PSH, FH, SH, Meds and Allergies. -We provided refills for any medications we will prescribe as needed. -We addressed current concerns per orders and patient instructions. -We have asked for records for pertinent exams, studies, vaccines and notes from previous providers. -We have advised patient to follow up per instructions below.  Needs labs and preventive care. She opted to do a physical for this and advised to schedule.  -Patient advised to return or notify a doctor immediately if symptoms worsen or persist or new concerns arise.  Patient Instructions  BEFORE YOU LEAVE: -schedule physical in 3 month -xray sheet -patellafem syndrome exercises  For the skin problem on your ear: -use the cream twice dialy for 1 week -if not resolving in 2 weeks call for an appointment with the dermatologist for removal/biopsy  For the knee pain: -do the exercises provided 4 days per week and get the xrays  Please get help for the alcohol and depression - call to schedule appointment with a psychiatrist and consider [facility] or another alcohol  rehab program.  We recommend the following healthy lifestyle measures: - eat a healthy diet consisting of lots of vegetables, fruits, beans, nuts, seeds, healthy meats such as white chicken and fish and whole grains.  - avoid fried foods, fast food, processed foods, sodas, red meet and other fattening foods.  - get a least 150 minutes of aerobic exercise per week.       Colin Benton R.

## 2014-04-22 NOTE — Telephone Encounter (Signed)
emmi mailed  °

## 2014-04-22 NOTE — Progress Notes (Signed)
Pre visit review using our clinic review tool, if applicable. No additional management support is needed unless otherwise documented below in the visit note. 

## 2014-07-27 ENCOUNTER — Ambulatory Visit (INDEPENDENT_AMBULATORY_CARE_PROVIDER_SITE_OTHER): Payer: Self-pay | Admitting: Family Medicine

## 2014-07-27 DIAGNOSIS — R69 Illness, unspecified: Secondary | ICD-10-CM

## 2014-07-27 NOTE — Progress Notes (Signed)
NO SHOW FOR CPE  On ROC prior to visit:  Jean Perry recently established care here and prior to that had not had any regular medical care nor had done any preventive measures in a very long time.   PMH Depression/Alcohol abuse: -I advised and offered psychiatric care and rehab and provided resources  -she has struggled with this for a long time (many years) with a low grade depressed mood all the time -has been on medications in the past but not compliant -she has struggled more the last few years since her son came back from the war and has health issues, and she now lives alone as her children are grown -drinks heavy on a frequent basis - 5th of whiskey daily -reports has stopped without withdrawal symptoms on many occassions -denied in past SI or thoughts of self harm, hospitalization in the past -denied IV drug use or recreational drug use  Elevated Blood Pressure: -she reports she has not ever had blood pressure issues -denies:   Bilateral knee pain: -reports started after twisted L knee in 2014 -moderate to severe, shar pain in both knees for many years but L >R, worse when getting up and down -better with rest -denies:  -HEP for PFS given and advised imaging but she did not do this  Obesity: -no regular exercise, poor diet

## 2015-06-05 ENCOUNTER — Emergency Department (HOSPITAL_COMMUNITY): Payer: BLUE CROSS/BLUE SHIELD

## 2015-06-05 ENCOUNTER — Emergency Department (HOSPITAL_COMMUNITY)
Admission: EM | Admit: 2015-06-05 | Discharge: 2015-06-05 | Disposition: A | Discharge status: Home / Self Care | Payer: BLUE CROSS/BLUE SHIELD | Attending: Emergency Medicine | Admitting: Emergency Medicine

## 2015-06-05 ENCOUNTER — Encounter (HOSPITAL_COMMUNITY): Payer: Self-pay | Admitting: Emergency Medicine

## 2015-06-05 DIAGNOSIS — F17200 Nicotine dependence, unspecified, uncomplicated: Secondary | ICD-10-CM | POA: Diagnosis not present

## 2015-06-05 DIAGNOSIS — F329 Major depressive disorder, single episode, unspecified: Secondary | ICD-10-CM | POA: Diagnosis not present

## 2015-06-05 DIAGNOSIS — R059 Cough, unspecified: Secondary | ICD-10-CM

## 2015-06-05 DIAGNOSIS — R05 Cough: Secondary | ICD-10-CM | POA: Diagnosis not present

## 2015-06-05 DIAGNOSIS — F1010 Alcohol abuse, uncomplicated: Secondary | ICD-10-CM

## 2015-06-05 LAB — CBC WITH DIFFERENTIAL/PLATELET
BASOS ABS: 0 10*3/uL (ref 0.0–0.1)
Basophils Relative: 0 %
Eosinophils Absolute: 0.2 10*3/uL (ref 0.0–0.7)
Eosinophils Relative: 2 %
HCT: 36.8 % (ref 36.0–46.0)
Hemoglobin: 11.9 g/dL — ABNORMAL LOW (ref 12.0–15.0)
LYMPHS PCT: 34 %
Lymphs Abs: 2.4 10*3/uL (ref 0.7–4.0)
MCH: 27.7 pg (ref 26.0–34.0)
MCHC: 32.3 g/dL (ref 30.0–36.0)
MCV: 85.8 fL (ref 78.0–100.0)
MONO ABS: 0.3 10*3/uL (ref 0.1–1.0)
Monocytes Relative: 4 %
Neutro Abs: 4.2 10*3/uL (ref 1.7–7.7)
Neutrophils Relative %: 60 %
PLATELETS: 304 10*3/uL (ref 150–400)
RBC: 4.29 MIL/uL (ref 3.87–5.11)
RDW: 15.7 % — ABNORMAL HIGH (ref 11.5–15.5)
WBC: 7 10*3/uL (ref 4.0–10.5)

## 2015-06-05 LAB — COMPREHENSIVE METABOLIC PANEL
ALBUMIN: 3.6 g/dL (ref 3.5–5.0)
ALK PHOS: 77 U/L (ref 38–126)
ALT: 77 U/L — ABNORMAL HIGH (ref 14–54)
AST: 76 U/L — ABNORMAL HIGH (ref 15–41)
Anion gap: 9 (ref 5–15)
CO2: 23 mmol/L (ref 22–32)
Calcium: 8.9 mg/dL (ref 8.9–10.3)
Chloride: 112 mmol/L — ABNORMAL HIGH (ref 101–111)
Creatinine, Ser: 0.65 mg/dL (ref 0.44–1.00)
GFR calc Af Amer: >60 mL/min (ref >60)
GFR calc non Af Amer: >60 mL/min (ref >60)
Glucose, Bld: 111 mg/dL — ABNORMAL HIGH (ref 65–99)
POTASSIUM: 3.1 mmol/L — AB (ref 3.5–5.1)
Sodium: 144 mmol/L (ref 135–145)
TOTAL PROTEIN: 6.6 g/dL (ref 6.5–8.1)
Total Bilirubin: 0.2 mg/dL — ABNORMAL LOW (ref 0.3–1.2)

## 2015-06-05 MED ORDER — ALBUTEROL SULFATE HFA 108 (90 BASE) MCG/ACT IN AERS
1.0000 | INHALATION_SPRAY | Freq: Once | RESPIRATORY_TRACT | Status: AC
  Administered 2015-06-05: 1 via RESPIRATORY_TRACT
  Filled 2015-06-05: qty 6.7

## 2015-06-05 MED ORDER — IPRATROPIUM-ALBUTEROL 0.5-2.5 (3) MG/3ML IN SOLN
3.0000 mL | Freq: Once | RESPIRATORY_TRACT | Status: AC
  Administered 2015-06-05: 3 mL via RESPIRATORY_TRACT
  Filled 2015-06-05: qty 3

## 2015-06-05 MED ORDER — AEROCHAMBER Z-STAT PLUS/MEDIUM MISC
1.0000 | Freq: Once | Status: AC
  Administered 2015-06-05: 1

## 2015-06-05 MED ORDER — POTASSIUM CHLORIDE CRYS ER 20 MEQ PO TBCR
40.0000 meq | EXTENDED_RELEASE_TABLET | Freq: Once | ORAL | Status: AC
  Administered 2015-06-05: 40 meq via ORAL
  Filled 2015-06-05: qty 2

## 2015-06-05 NOTE — ED Notes (Addendum)
Pt c/o productive cough x1 week. Denies fever. Hx of pneumonia. Pt also states that she has been "unable to control her bowel and bladder for over a week." Pt tearful in triage.

## 2015-06-05 NOTE — ED Notes (Signed)
First call no answer

## 2015-06-05 NOTE — Progress Notes (Signed)
[facility] consulted for pcp and outpatient alcohol rehab resources.  [facility] consulted EDSW for outpatient rehab resources.  Patient listed as having [employer] insurance with a Dr. Maudie Mercury listed as her pcp.  Patient reports she has never seen that doctor.  [facility] provided patient with a list of pcps who accept BCBS insurance within a 25 mile radius of patient's zip code [phone].  Patient thankful for resources.  No further EDCM needs at this time.

## 2015-06-05 NOTE — Discharge Instructions (Signed)
You were seen today for a cough. Follow up with your primary care provider within 2 days to have your cough evaluated. If you experience worsening cough, shortness of breath, fever, chills return to the Emergency Department.   Please see below for a list of resources to aid you with your alcohol dependence.   Community Resource Guide Outpatient Counseling/Substance Abuse Adult The [employer] 211 is a great source of information about community services available.  Access by dialing 2-1-1 from anywhere in [place], or by website -  CustodianSupply.fi.   Other Local Resources (Updated 01/2015)  [facility]  Crisis Hotline, available 24 hours a day, 7 days a week: [place], [place]   [facility]  Crisis Hotline, available 24 hours a day, 7 days a week: [place], [place]  [facility]  Suicide Prevention Hotline, available 24 hours a day, 7 days a week: [place], [place], available 24 hours a day, 7 days a week: [place], [place] Access to [employer], available 24 hours a day, 7 days a week: [phone] All   [facility]  Crisis Hotline, available 24 hours a day, 7 days a week: 980-667-5687 All   Other Local Resources (Updated 01/2015)  Outpatient Counseling/ Substance Abuse Programs  Services     Address and Phone Number  ADS (Alcohol and Drug Services)   Options include Individual counseling, group counseling, intensive outpatient program (several hours a day, several days a week)  Offers depression assessments  Provides methadone maintenance program [phone] [address], [place], [place] partial hospitalization/day treatment and DUI/DWI programs  [employer], private insurance  [phone] [address], [id number] [place], [place] [address]  [place] include intensive outpatient program (several hours a day, several days a week), outpatient treatment, DUI/DWI services, family education  Also has some services specifically for [employer] transitional housing  [phone] [address] [place], [place] [address]     [place] Medicare, private pay, and private insurance [phone] [address] [place], [place] [address]  [facility]  Services include individual counseling, substance abuse intensive outpatient program (several hours a day, several days a week), day treatment  Blinda [place]ather[place], Medicaid, private insurance [phone] [address], [place], [place] [address]  [facility]   Offers substance abuse intensive outpatient program (several hours a day, several days a week), partial hospitalization program [phone] [address] [place], [place] [address]  [phone] [address] [place], [place] [address]  [phone] [place], [place] [address]  [phone], [place], [place] [address]  [facility]  Individual counseling only  Accepts private insurance only [phone] [address], [place],  [address]  [facility]: Methadone Clinic  Methadone maintenance program [phone] [place],  [address]  [facility] providing substance abuse and mental health counseling  Accepts Medicaid, Medicare, private insurance  Offers sliding scale for uninsured [phone] [place], [place] in [place] individual counseling, and intensive in-home services [phone] [address] [place],  [address]  [facility]  of  the [place] individual counseling, family counseling, group therapy, domestic violence counseling, consumer credit counseling  Accepts Medicare, Medicaid, private insurance  Offers sliding scale for uninsured [phone] [address] [place], [place] [address]  [phone] [facility], [address] [place], [place]  [facility]  Offers individual, family and group counseling  3 locations - [place], [place], and [place]  [place] [address] [place], [place] [address]  [address] [place], [place] [address]  [place], [place] [address]  [facility]    Offers psychiatric assessment, 8-week Intensive Outpatient Program (several hours a day, several times a week, daytime or evenings), early recovery group, family Program, medication management  Private pay or private insurance only [phone], or  [phone] [address] [place], [place] [address]  [facility]  Offers individual, couples and family counseling  Accepts Medicaid, private insurance, and sliding scale for uninsured [phone] [address] [place], [place] [address]  Launa Flight, MD  Individual counseling  Private insurance [phone] [place], [place] [address]  [facility]   Offers assessment, substance abuse treatment, and behavioral health treatment [phone] [address] [place], [place] [address]  [place]  Individual counseling  Accepts private insurance [phone] [place], [place] [address]  [facility]  Individual counseling  Blinda Leatherwood, private insurance [phone] [place], [place] [address]  [place]    Offers intensive outpatient program (several hours a day, several times a week)  Private pay, private insurance [phone] [place], [place]  Individual counseling  Medicare, private insurance [phone] [address] [place], [place] [address]  [place]    Offers intensive outpatient program (several hours a day, several times a week) and partial hospitalization program [phone] [place], [place] [address]  Letta Moynahan, MD  Individual counseling [phone] [address] [place], [place] [address]  [place] counseling to individuals, couples, and families  Accepts Medicare and private insurance; offers sliding scale for uninsured [phone] [place], [place] [address]  [facility]  Christian counseling [phone] [address], [place], [place] [address]  [facility] [employer] crisis counseling, individual counseling, group therapy, in-home therapy, domestic violence services, day treatment, DWI services, Conservation officer, nature (CST), Assertive Community Treatment Team (ACTT), substance abuse Intensive Outpatient Program (several hours a day, several times a week)  2 locations - [place] and [place], [place] [address]  [phone] [address] [place], [place] [address]  [place] counseling and group therapy  [place] insurance, [place], [place] [phone] [address] [place], [place]  [facility]  Offers individual and family counseling  Offers LGBTQ services  Accepts private insurance and private pay [phone] [place], [place] [address]  [facility]    Offers individual counseling, group therapy, and outpatient detox  Accepts private insurance [phone] [place], [place] Medicare, private insurance [phone] [address] [place],  [address]  [employer]  Individual counseling  [employer], private insurance [phone] [address] [place],  [address]  [place]  Offers substance abuse Intensive Outpatient Program (several hours a day, several times a week) [phone], or [phone] [place], [place]

## 2015-06-05 NOTE — Progress Notes (Signed)
CSW was notified by Nurse CM that pt is interested in substance abuse facilities for alcohol.  CSW met with patient at bedside. Patient was alert and oriented. There was no family present.  Patient confirms that she is interested in inpatient facility information. CSW provided the patient with a list of facilities. Patient states that she is not interested in any other kind of community resources at this time.  Patient informed CSW that that she lives at home in [place]. Patient states that she completes all ADL's independently and is employed. Patient states that she has a sister who is supportive, but is not not local. Patient states that her sister lives in [place].  Patient informed CSW that she has no questions for CSW at this time.  Willette Brace [phone] ED CSW 06/05/2015 5:28 PM

## 2015-06-05 NOTE — ED Notes (Signed)
Patient left AMA.

## 2015-06-05 NOTE — ED Provider Notes (Signed)
CSN: [id number]     Arrival date & time 06/05/15  1350 History   First MD Initiated Contact with Patient 06/05/15 1521     Chief Complaint  Patient presents with  . Cough     (Consider location/radiation/quality/duration/timing/severity/associated sxs/prior Treatment) HPI   Patient is a 45 year old female with a past medical history of depression, alcohol abuse who presents to the ED with 2 weeks of productive cough. She states associated sinus congestion, and mild SOB. She states history of pneumonia and worried that this is pneumonia. She has tried cough medicine without relief. She states she drinks a fifth of alcohol per day. She endorses episodes of vomiting while drinking. She states she is depressed. When asked if she thinks about hurting herself she stated "if I tell U you'll be mad at me". She states no plan or desire to hurt herself and no desire to hurt others. She states she she drank a fifth of alcohol today. She states she is a functional alcoholic.   Past Medical History  Diagnosis Date  . Depression   . History of chicken pox   . Urine incontinence   . Alcohol use ([place])   . Elevated blood pressure   . Knee pain   . Fibroids    History reviewed. No pertinent past surgical history. Family History  Problem Relation Age of Onset  . Alcoholism Mother   . Alcoholism Father   . Lung cancer Maternal Grandfather    Social History  Substance Use Topics  . Smoking status: Current Every Day Smoker -- 1.00 packs/day  . Smokeless tobacco: Never Used  . Alcohol Use: Yes     Comment: pt states she drinks 1/2 a fifth of liquor a day   OB History    No data available     Review of Systems  Constitutional: Positive for chills. Negative for fever.  HENT: Positive for sinus pressure. Negative for ear pain, rhinorrhea, sore throat and trouble swallowing.   Eyes: Negative for discharge and itching.  Respiratory: Positive for cough and shortness of breath. Negative for chest  tightness and wheezing.   Cardiovascular: Negative for chest pain and leg swelling.  Gastrointestinal: Negative for abdominal pain, diarrhea and blood in stool.  Genitourinary: Negative for hematuria.  Musculoskeletal: Negative for back pain and neck pain.  Skin: Negative for color change.  Neurological: Negative for dizziness, syncope and weakness.  Psychiatric/Behavioral: Positive for dysphoric mood. Negative for self-injury.      Allergies  Review of patient's allergies indicates no known allergies.  Home Medications   Prior to Admission medications   Medication Sig Start Date End Date Taking? Authorizing Provider  triamcinolone cream (KENALOG) 0.1 % Apply 1 application topically 2 (two) times daily. Patient not taking: Reported on 06/05/2015 04/22/14   Lucretia Kern, DO   BP 139/87 mmHg  Pulse 100  Temp(Src) 98.1 F (36.7 C) (Oral)  Resp 18  SpO2 100%  LMP 04/05/2015 Physical Exam  Constitutional: Vital signs are normal. She appears well-developed and well-nourished.  Intermittent crying during exam  HENT:  Mouth/Throat: Uvula is midline, oropharynx is clear and moist and mucous membranes are normal. No uvula swelling. No posterior oropharyngeal edema or posterior oropharyngeal erythema.  Neck: Trachea normal and normal range of motion. Neck supple.  Cardiovascular: Regular rhythm, normal heart sounds and normal pulses.   Pulses:      Radial pulses are 2+ on the right side, and 2+ on the left side.  Dorsalis pedis pulses are 2+ on the right side, and 2+ on the left side.  Pulmonary/Chest: Effort normal. No accessory muscle usage. No respiratory distress.  Mild diffuse wheezes  Abdominal: Normal appearance and bowel sounds are normal. There is no tenderness.  Lymphadenopathy:    She has no cervical adenopathy.  Skin: Skin is warm and dry. She is not diaphoretic.  Psychiatric: Her speech is normal. Cognition and memory are normal. She exhibits a depressed mood.     ED Course  Procedures (including critical care time) Labs Review Labs Reviewed  COMPREHENSIVE METABOLIC PANEL - Abnormal; Notable for the following:    Potassium 3.1 (*)    Chloride 112 (*)    Glucose, Bld 111 (*)    BUN <5 (*)    AST 76 (*)    ALT 77 (*)    Total Bilirubin 0.2 (*)    All other components within normal limits  CBC WITH DIFFERENTIAL/PLATELET - Abnormal; Notable for the following:    Hemoglobin 11.9 (*)    RDW 15.7 (*)    All other components within normal limits    Imaging Review Dg Chest 2 View  06/05/2015  CLINICAL DATA:  Productive cough yellow mucus EXAM: CHEST  2 VIEW COMPARISON:  Radiograph 01/25/2014 FINDINGS: Normal mediastinum and cardiac silhouette. Normal pulmonary vasculature. No evidence of effusion, infiltrate, or pneumothorax. No acute bony abnormality. IMPRESSION: No acute cardiopulmonary process. Electronically Signed   By: Suzy Bouchard M.D.   On: 06/05/2015 16:22   I have personally reviewed and evaluated these images and lab results as part of my medical decision-making.   EKG Interpretation None      MDM   Final diagnoses:  Cough  Alcohol abuse    Pt afebrile, negative chest xray with 2 weeks of productive cough. This is likely viral URI. Pt did have mild wheezing so we administered a Duoneb and Dr. Winfred Leeds instructed the pt on how to use an albuterol inhaler with a spacer. Breathing improved after neb treatment. Pt requesting help with her alcohol abuse. Discussed return precautions, smoking cessation and resources for help with her alcohol abuse.  Pt denies she wants to hurt herself so no suicidal concern at this time. Pt left before receiving discharge paperwork. Will mail to her home address.    Kalman Drape, PA 06/05/15 [place], MD 06/06/15 [phone]

## 2015-06-05 NOTE — ED Provider Notes (Addendum)
Complains of nonproductive cough for 2 weeks. Denies nausea or vomiting. Denies fever. Admits to shortness of breath. Patient also admits to frequent alcohol use. Last use alcohol as morning On exam alert Glasgow Coma Score 15 does not appear intoxicated. Nontoxic-appearing. Heart regular rate and rhythm lungs expiratory wheezes. No respiratory distress. Speaks in paragraphs. Chest x-ray viewed by me. I counseled patient for 5 minutes on smoking cessation.. Patient also wishing help with alcohol problem. She vehemently denies harm herself.  Orlie Dakin, MD 06/05/15 [place], MD 06/05/15 1724  6 PM patient breathing normal after nebulized treatment. She feels much improved and ready to go home  Orlie Dakin, MD 06/05/15 [phone]

## 2015-08-12 ENCOUNTER — Ambulatory Visit (HOSPITAL_COMMUNITY)
Admission: RE | Admit: 2015-08-12 | Discharge: 2015-08-12 | Disposition: A | Discharge status: Home / Self Care | Payer: BLUE CROSS/BLUE SHIELD | Source: Home / Self Care | Attending: Psychiatry | Admitting: Psychiatry

## 2015-08-12 ENCOUNTER — Encounter (HOSPITAL_COMMUNITY): Payer: Self-pay

## 2015-08-12 ENCOUNTER — Emergency Department (HOSPITAL_COMMUNITY)
Admission: EM | Admit: 2015-08-12 | Discharge: 2015-08-13 | Discharge status: Against Medical Advice | Payer: BLUE CROSS/BLUE SHIELD | Attending: Emergency Medicine | Admitting: Emergency Medicine

## 2015-08-12 DIAGNOSIS — D259 Leiomyoma of uterus, unspecified: Secondary | ICD-10-CM

## 2015-08-12 DIAGNOSIS — R7989 Other specified abnormal findings of blood chemistry: Secondary | ICD-10-CM

## 2015-08-12 DIAGNOSIS — F17210 Nicotine dependence, cigarettes, uncomplicated: Secondary | ICD-10-CM | POA: Diagnosis not present

## 2015-08-12 DIAGNOSIS — F329 Major depressive disorder, single episode, unspecified: Secondary | ICD-10-CM | POA: Diagnosis not present

## 2015-08-12 DIAGNOSIS — F1010 Alcohol abuse, uncomplicated: Secondary | ICD-10-CM | POA: Diagnosis not present

## 2015-08-12 DIAGNOSIS — R32 Unspecified urinary incontinence: Secondary | ICD-10-CM

## 2015-08-12 DIAGNOSIS — Z720 Tobacco use: Secondary | ICD-10-CM

## 2015-08-12 DIAGNOSIS — F1024 Alcohol dependence with alcohol-induced mood disorder: Secondary | ICD-10-CM | POA: Insufficient documentation

## 2015-08-12 DIAGNOSIS — F331 Major depressive disorder, recurrent, moderate: Secondary | ICD-10-CM | POA: Insufficient documentation

## 2015-08-12 DIAGNOSIS — I10 Essential (primary) hypertension: Secondary | ICD-10-CM

## 2015-08-12 DIAGNOSIS — E876 Hypokalemia: Secondary | ICD-10-CM

## 2015-08-12 DIAGNOSIS — R945 Abnormal results of liver function studies: Secondary | ICD-10-CM | POA: Diagnosis not present

## 2015-08-12 DIAGNOSIS — F32A Depression, unspecified: Secondary | ICD-10-CM

## 2015-08-12 LAB — CBC WITH DIFFERENTIAL/PLATELET
BASOS ABS: 0 10*3/uL (ref 0.0–0.1)
Basophils Relative: 0 %
EOS PCT: 2 %
Eosinophils Absolute: 0.2 10*3/uL (ref 0.0–0.7)
HCT: 37.9 % (ref 36.0–46.0)
Hemoglobin: 12 g/dL (ref 12.0–15.0)
LYMPHS PCT: 41 %
Lymphs Abs: 3.9 10*3/uL (ref 0.7–4.0)
MCH: 27.1 pg (ref 26.0–34.0)
MCHC: 31.7 g/dL (ref 30.0–36.0)
MCV: 85.6 fL (ref 78.0–100.0)
MONO ABS: 0.4 10*3/uL (ref 0.1–1.0)
MONOS PCT: 4 %
Neutro Abs: 5 10*3/uL (ref 1.7–7.7)
Neutrophils Relative %: 53 %
PLATELETS: 384 10*3/uL (ref 150–400)
RBC: 4.43 MIL/uL (ref 3.87–5.11)
RDW: 15.7 % — AB (ref 11.5–15.5)
WBC: 9.6 10*3/uL (ref 4.0–10.5)

## 2015-08-12 LAB — RAPID URINE DRUG SCREEN, HOSP PERFORMED
Amphetamines: NOT DETECTED
Barbiturates: NOT DETECTED
Benzodiazepines: NOT DETECTED
Cocaine: NOT DETECTED
Opiates: NOT DETECTED
Tetrahydrocannabinol: NOT DETECTED

## 2015-08-12 LAB — COMPREHENSIVE METABOLIC PANEL
ALT: 69 U/L — AB (ref 14–54)
AST: 75 U/L — AB (ref 15–41)
Albumin: 4.1 g/dL (ref 3.5–5.0)
Alkaline Phosphatase: 83 U/L (ref 38–126)
Anion gap: 10 (ref 5–15)
BUN: 8 mg/dL (ref 6–20)
CHLORIDE: 109 mmol/L (ref 101–111)
CO2: 22 mmol/L (ref 22–32)
CREATININE: 0.57 mg/dL (ref 0.44–1.00)
Calcium: 8.5 mg/dL — ABNORMAL LOW (ref 8.9–10.3)
GFR calc Af Amer: >60 mL/min (ref >60)
GFR calc non Af Amer: >60 mL/min (ref >60)
Glucose, Bld: 111 mg/dL — ABNORMAL HIGH (ref 65–99)
Potassium: 3.2 mmol/L — ABNORMAL LOW (ref 3.5–5.1)
Sodium: 141 mmol/L (ref 135–145)
Total Bilirubin: 0.1 mg/dL — ABNORMAL LOW (ref 0.3–1.2)
Total Protein: 7.9 g/dL (ref 6.5–8.1)

## 2015-08-12 LAB — SALICYLATE LEVEL

## 2015-08-12 LAB — ETHANOL: ALCOHOL ETHYL (B): 142 mg/dL — AB (ref <5)

## 2015-08-12 LAB — ACETAMINOPHEN LEVEL

## 2015-08-12 LAB — I-STAT BETA HCG BLOOD, ED (MC, WL, AP ONLY): I-stat hCG, quantitative: <5 m[IU]/mL (ref <5)

## 2015-08-12 MED ORDER — ALUM & MAG HYDROXIDE-SIMETH 200-200-20 MG/5ML PO SUSP: 30.0000 mL | ORAL | Status: DC | PRN

## 2015-08-12 MED ORDER — ONDANSETRON HCL 4 MG PO TABS: 4.0000 mg | ORAL_TABLET | Freq: Three times a day (TID) | ORAL | Status: DC | PRN

## 2015-08-12 MED ORDER — LORAZEPAM 2 MG/ML IJ SOLN: 0.0000 mg | Freq: Four times a day (QID) | INTRAMUSCULAR | Status: DC

## 2015-08-12 MED ORDER — LORAZEPAM 2 MG/ML IJ SOLN: 0.0000 mg | Freq: Two times a day (BID) | INTRAMUSCULAR | Status: DC

## 2015-08-12 MED ORDER — LORAZEPAM 1 MG PO TABS: 1.0000 mg | ORAL_TABLET | Freq: Three times a day (TID) | ORAL | Status: DC | PRN

## 2015-08-12 MED ORDER — IBUPROFEN 200 MG PO TABS: 600.0000 mg | ORAL_TABLET | Freq: Three times a day (TID) | ORAL | Status: DC | PRN

## 2015-08-12 MED ORDER — ACETAMINOPHEN 325 MG PO TABS: 650.0000 mg | ORAL_TABLET | ORAL | Status: DC | PRN

## 2015-08-12 MED ORDER — NICOTINE 21 MG/24HR TD PT24: 21.0000 mg | MEDICATED_PATCH | Freq: Every day | TRANSDERMAL | Status: DC

## 2015-08-12 MED ORDER — VITAMIN B-1 100 MG PO TABS: 100.0000 mg | ORAL_TABLET | Freq: Every day | ORAL | Status: DC

## 2015-08-12 MED ORDER — ZOLPIDEM TARTRATE 5 MG PO TABS: 5.0000 mg | ORAL_TABLET | Freq: Every evening | ORAL | Status: DC | PRN

## 2015-08-12 MED ORDER — THIAMINE HCL 100 MG/ML IJ SOLN: 100.0000 mg | Freq: Every day | INTRAMUSCULAR | Status: DC

## 2015-08-12 MED ORDER — LORAZEPAM 1 MG PO TABS: 0.0000 mg | ORAL_TABLET | Freq: Four times a day (QID) | ORAL | Status: DC

## 2015-08-12 MED ORDER — POTASSIUM CHLORIDE CRYS ER 20 MEQ PO TBCR: 40.0000 meq | EXTENDED_RELEASE_TABLET | Freq: Once | ORAL | Status: DC

## 2015-08-12 MED ORDER — LORAZEPAM 1 MG PO TABS: 0.0000 mg | ORAL_TABLET | Freq: Two times a day (BID) | ORAL | Status: DC

## 2015-08-12 NOTE — ED Provider Notes (Signed)
CSN: [id number]     Arrival date & time 08/12/15  1626 History   First MD Initiated Contact with Patient 08/12/15 1731     No chief complaint on file.    (Consider location/radiation/quality/duration/timing/severity/associated sxs/prior Treatment) HPI Comments: Jean Perry is a 45 y.o. female with a PMHx of depression, alcohol use, HTN, and chronic knee pain, who presents to the ED via [facility] transporter from [facility] for med clearance. Chart review reveals she was seen at [facility] by [facility] just prior to arrival, for depression and alcohol use, and they reported in their notes: "Patient request residential treatment for alcohol use and depression. The Patient will be sent to [facility] for medical clearance and residential treatment will be sought." Patient reports to me that she has had depression "her entire life" but that her depressed feelings have been worse over the last 6 months, she does not take any psychiatric medications and does not have a therapist. She has been drinking a fifth of liquor daily, and endorses alcohol use today. Smokes cigarettes, but denies illicit drug use. Denies SI/HI/AVH. Here voluntarily. During the encounter, she perseverates about a prior PCP encounter last year regarding an ear lesion that the PA that saw her "didn't care about and didn't help her", and she has some flight of ideas during the history taking. However, she denies any medical complaints at this time. Doesn't take any medicines at home, and denies any medical problems aside from those listed above.   Patient is a 45 y.o. female presenting with mental health disorder. The history is provided by the patient and medical records. No language interpreter was used.  Mental Health Problem Presenting symptoms: depression   Presenting symptoms: no hallucinations, no homicidal ideas and no suicidal thoughts   Onset quality:  Gradual Duration:  6 months Timing:  Constant Progression:  Worsening Chronicity:   Chronic Context: alcohol use   Treatment compliance:  Untreated Relieved by:  None tried Worsened by:  Alcohol Ineffective treatments:  None tried Associated symptoms: no abdominal pain and no chest pain   Risk factors: hx of mental illness     Past Medical History  Diagnosis Date  . Depression   . History of chicken pox   . Urine incontinence   . Alcohol use ([place])   . Elevated blood pressure   . Knee pain   . Fibroids    No past surgical history on file. Family History  Problem Relation Age of Onset  . Alcoholism Mother   . Alcoholism Father   . Lung cancer Maternal Grandfather    Social History  Substance Use Topics  . Smoking status: Current Every Day Smoker -- 1.00 packs/day  . Smokeless tobacco: Never Used  . Alcohol Use: Yes     Comment: pt states she drinks 1/2 a fifth of liquor a day   OB History    No data available     Review of Systems  Constitutional: Negative for fever and chills.  Respiratory: Negative for shortness of breath.   Cardiovascular: Negative for chest pain.  Gastrointestinal: Negative for nausea, vomiting, abdominal pain, diarrhea and constipation.  Genitourinary: Negative for dysuria and hematuria.  Musculoskeletal: Negative for myalgias and arthralgias.  Skin: Negative for color change.  Allergic/Immunologic: Negative for immunocompromised state.  Neurological: Negative for weakness and numbness.  Psychiatric/Behavioral: Negative for suicidal ideas, homicidal ideas, hallucinations and confusion.       +EtOH use +depression Denies SI/HI/AVH   10 Systems reviewed and are negative for  acute change except as noted in the HPI.    Allergies  Review of patient's allergies indicates no known allergies.  Home Medications   Prior to Admission medications   Medication Sig Start Date End Date Taking? Authorizing Provider  triamcinolone cream (KENALOG) 0.1 % Apply 1 application topically 2 (two) times daily. Patient not taking: Reported  on 06/05/2015 04/22/14   Lucretia Kern, DO   BP 124/72 mmHg  Pulse 99  Temp(Src) 98.4 F (36.9 C) (Oral)  Resp 16  Ht 5\' 5"  (1.651 m)  Wt 104.327 kg  BMI 38.27 kg/m2  SpO2 97% Physical Exam  Constitutional: She is oriented to person, place, and time. Vital signs are normal. She appears well-developed and well-nourished.  Non-toxic appearance. No distress.  Afebrile, nontoxic, NAD, appears intoxicated  HENT:  Head: Normocephalic and atraumatic.  Mouth/Throat: Oropharynx is clear and moist and mucous membranes are normal.  Eyes: Conjunctivae and EOM are normal. Right eye exhibits no discharge. Left eye exhibits no discharge.  Neck: Normal range of motion. Neck supple.  Cardiovascular: Normal rate, regular rhythm, normal heart sounds and intact distal pulses.  Exam reveals no gallop and no friction rub.   No murmur heard. Pulmonary/Chest: Effort normal and breath sounds normal. No respiratory distress. She has no decreased breath sounds. She has no wheezes. She has no rhonchi. She has no rales.  Abdominal: Soft. Normal appearance and bowel sounds are normal. She exhibits no distension. There is no tenderness. There is no rigidity, no rebound, no guarding, no CVA tenderness, no tenderness at McBurney's point and negative Murphy's sign.  Musculoskeletal: Normal range of motion.  Neurological: She is alert and oriented to person, place, and time. She has normal strength. She displays no tremor. No sensory deficit.  No tremors, speech clear  Skin: Skin is warm, dry and intact. No rash noted.  Psychiatric: Her speech is normal. Her affect is blunt. She is not actively hallucinating. She exhibits a depressed mood. She expresses no homicidal and no suicidal ideation. She expresses no suicidal plans and no homicidal plans.  Depressed flat affect, appears intoxicated, gets upset talking about past issues with a PCP provider, sort of a flight-of-ideas pattern, but able to be redirected and calmed. Denies  SI/HI/AVH.   Nursing note and vitals reviewed.   ED Course  Procedures (including critical care time) Labs Review Labs Reviewed  COMPREHENSIVE METABOLIC PANEL - Abnormal; Notable for the following:    Potassium 3.2 (*)    Glucose, Bld 111 (*)    Calcium 8.5 (*)    AST 75 (*)    ALT 69 (*)    Total Bilirubin 0.1 (*)    All other components within normal limits  ETHANOL - Abnormal; Notable for the following:    Alcohol, Ethyl (B) 142 (*)    All other components within normal limits  CBC WITH DIFFERENTIAL/PLATELET - Abnormal; Notable for the following:    RDW 15.7 (*)    All other components within normal limits  ACETAMINOPHEN LEVEL - Abnormal; Notable for the following:    Acetaminophen (Tylenol), Serum <10 (*)    All other components within normal limits  SALICYLATE LEVEL  URINE RAPID DRUG SCREEN, HOSP PERFORMED  I-STAT BETA HCG BLOOD, ED ([facility], [facility], [facility] ONLY)    Imaging Review No results found. I have personally reviewed and evaluated these images and lab results as part of my medical decision-making.   EKG Interpretation None      MDM   Final diagnoses:  Depression  Alcohol abuse  Tobacco use  Elevated LFTs  Hypokalemia    45 y.o. female here from [facility] for med clearance, seen there just PTA for depression/sadness and alcohol abuse. They recommended residential treatment, sent here for med clearance. Denies SI/HI/AVH, no tremors or signs of withdrawal on exam. Pt very upset and perseverates on prior PCP providers not taking care of her well. Physical exam benign. Will get clearance labs and then TTS consult to assist with placement in residential facility. Here voluntarily, doubt need for IVC paperwork at this time but if she became suicidal/homicidal then could consider this as an option. Will reassess after med clearance labs return.   6:53 PM Beta HCG neg, CMP with mild hypokalemia at 3.2 which seems to be a somewhat chronic issue, will give kdur 67mEq now. AST/ALT  elevated likely alcohol related. EtOH 142. CBC w/diff WNL, salicylate and acetaminophen WNL. UDS not yet done but doesn't hold up her med clearance, pt medically cleared. Psych hold and CIWA orders placed, please see TTS notes for further documentation of care/dispo. Pt stable at this time.  BP 124/72 mmHg  Pulse 99  Temp(Src) 98.4 F (36.9 C) (Oral)  Resp 16  Ht 5\' 5"  (1.651 m)  Wt 104.327 kg  BMI 38.27 kg/m2  SpO2 97%  LMP 07/29/2015  Meds ordered this encounter  Medications  . thiamine (VITAMIN B-1) tablet 100 mg    Sig:    Or  . thiamine (B-1) injection 100 mg    Sig:   . LORazepam (ATIVAN) tablet 0-4 mg    Sig:     Order Specific Question:  CIWA-AR < 5 =    Answer:  0    Order Specific Question:  CIWA-AR 5 -10 =    Answer:  1 mg    Order Specific Question:  CIWA-AR 11 -15 =    Answer:  2 mg    Order Specific Question:  CIWA-AR 16 -24 =    Answer:  2 mg    Order Specific Question:  CIWA-AR 16 -24 =    Answer:  Recheck CIWA-AR in 1 hour; if > 15 notify MD    Order Specific Question:  CIWA-AR > 24 =    Answer:  4 mg    Order Specific Question:  CIWA-AR > 24 =    Answer:  Call Rapid Response   Followed by  . LORazepam (ATIVAN) tablet 0-4 mg    Sig:     Order Specific Question:  CIWA-AR < 5 =    Answer:  0    Order Specific Question:  CIWA-AR 5 -10 =    Answer:  1 mg    Order Specific Question:  CIWA-AR 11 -15 =    Answer:  2 mg    Order Specific Question:  CIWA-AR 16 -24 =    Answer:  2 mg    Order Specific Question:  CIWA-AR 16 -24 =    Answer:  Recheck CIWA-AR in 1 hour; if > 15 notify MD    Order Specific Question:  CIWA-AR > 24 =    Answer:  4 mg    Order Specific Question:  CIWA-AR > 24 =    Answer:  Call Rapid Response  . LORazepam (ATIVAN) injection 0-4 mg    Sig:     Order Specific Question:  CIWA-AR < 5 =    Answer:  0    Order Specific Question:  CIWA-AR 5 -10 =  Answer:  1 mg    Order Specific Question:  CIWA-AR 11 -15 =    Answer:  2 mg     Order Specific Question:  CIWA-AR 16 -24 =    Answer:  2 mg    Order Specific Question:  CIWA-AR 16 -24 =    Answer:  Recheck CIWA-AR in 1 hour; if > 15 notify MD    Order Specific Question:  CIWA-AR > 24 =    Answer:  4 mg    Order Specific Question:  CIWA-AR > 24 =    Answer:  Call Rapid Response   Followed by  . LORazepam (ATIVAN) injection 0-4 mg    Sig:     Order Specific Question:  CIWA-AR < 5 =    Answer:  0    Order Specific Question:  CIWA-AR 5 -10 =    Answer:  1 mg    Order Specific Question:  CIWA-AR 11 -15 =    Answer:  2 mg    Order Specific Question:  CIWA-AR 16 -24 =    Answer:  2 mg    Order Specific Question:  CIWA-AR 16 -24 =    Answer:  Recheck CIWA-AR in 1 hour; if > 15 notify MD    Order Specific Question:  CIWA-AR > 24 =    Answer:  4 mg    Order Specific Question:  CIWA-AR > 24 =    Answer:  Call Rapid Response  . alum & mag hydroxide-simeth (MAALOX/MYLANTA) 200-200-20 MG/5ML suspension 30 mL    Sig:   . ondansetron (ZOFRAN) tablet 4 mg    Sig:   . nicotine (NICODERM CQ - dosed in mg/24 hours) patch 21 mg    Sig:   . zolpidem (AMBIEN) tablet 5 mg    Sig:   . ibuprofen (ADVIL,MOTRIN) tablet 600 mg    Sig:   . acetaminophen (TYLENOL) tablet 650 mg    Sig:   . LORazepam (ATIVAN) tablet 1 mg    Sig:   . potassium chloride SA (K-DUR,KLOR-CON) CR tablet 40 mEq    Sig:        Ania Levay Camprubi-Soms, PA-C 08/12/15 1854  Dorie Rank, MD 08/12/15 1921

## 2015-08-12 NOTE — ED Notes (Signed)
Pt does not have a bed at this time at [facility], pt will need to stay in ED and will they will be looking for placement

## 2015-08-12 NOTE — ED Notes (Addendum)
Patient states she came from [facility] for medical clearance. Patient denies SI/HI, visual or auditory hallucinations. Patient stated, "I'm just depressed. Patient denies drug use, but states she last drank whiskey this AM. Patient states she drinks daily.

## 2015-08-12 NOTE — BH Assessment (Signed)
1535:  [facility] Nurse Patti notified of Patient coming for medical clearance and staying while waiting for residential treatment placement.   1545:  [facility] notified to transport Patient from [facility] to [facility].  [facility] reports wait will be 20 minutes.

## 2015-08-12 NOTE — BH Assessment (Signed)
Assessment Note  Jean Perry is an 45 y.o. female, who reports taking a taxi to [facility] because of experiencing depression and excessive consumption of alcohol.  Patient was orientated x4, mood "depressed and sad", affect congruent with mood, tearful with pressured speech and flight of ideas.  Patient denied current SI, HI, AVH.  Patient reports consuming alcohol today and smelt of alcohol.  She reports consuming a 5th of liquor daily starting Perry the morning to control tremors and consuming until passing out at night.  The Patient reports consuming this amount of alcohol for several years.  She denied any other illicit substance use.  Patient denied being prescribed any medication.  Patient reports receiving 4 hours of sleep per night and gets to sleep by passing out from alcohol consumption.  She reports episodes of self isolating, crying, sadness, and sometimes not bathing on a daily basis.  She reports 1 previous hospitalization at [facility] 1996 for depression and alcohol consumption.  The Patient reports outpatient therapy at [facility] for depression [facility] 2012.  Patient reports having 5 adult children and experiencing conflict with all of them.  She reports living alone and working from home.  Patient request residential treatment for alcohol use and depression.   The Patient will be sent to [facility] for medical clearance and residential treatment will be sought.    Diagnosis: Alcohol Use Disorder, severe, Major Depressive Disorder, recurrent, moderate  Past Medical History:  Past Medical History  Diagnosis Date  . Depression   . History of chicken pox   . Urine incontinence   . Alcohol use ([place])   . Elevated blood pressure   . Knee pain   . Fibroids     No past surgical history on file.  Family History:  Family History  Problem Relation Age of Onset  . Alcoholism Mother   . Alcoholism Father   . Lung cancer Maternal Grandfather     Social History:  reports that she has been  smoking.  She has never used smokeless tobacco. She reports that she drinks alcohol. She reports that she does not use illicit drugs.  Additional Social History:     CIWA:   COWS:    Allergies: No Known Allergies  Home Medications:  (Not Perry a hospital admission)  OB/GYN Status:  No LMP recorded.  General Assessment Data Location of Assessment: [facility] Assessment [facility] TTS Assessment: [facility] system Is this a Tele or Face-to-Face Assessment?: Face-to-Face Is this an Initial Assessment or a Re-assessment for this encounter?: Initial Assessment Marital status: Divorced [place] name: Jean [facility] Is patient pregnant?: No Pregnancy Status: No Living Arrangements: Alone Can pt return to current living arrangement?: Yes Admission Status: Voluntary Is patient capable of signing voluntary admission?: Yes Referral Source: Self/Family/Friend Insurance type: [facility] Screening Exam ([place]) Medical Exam completed: No Reason for MSE not completed: Other: ([facility])  Crisis Care Plan Living Arrangements: Alone Legal Guardian: Other: (Self) Name of Psychiatrist: None Name of Therapist: None  Education Status Is patient currently [place] school?: No Current Grade: N/A Highest grade of school patient has completed: 9th (has GED) Name of school: N/A Contact person: N/A  Risk to self with the past 6 months Suicidal Ideation: No Has patient been a risk to self within the past 6 months prior to admission? : No Suicidal Intent: No Has patient had any suicidal intent within the past 6 months prior to admission? : No Is patient at risk for  suicide?: No Suicidal Plan?: No Has patient had any suicidal plan within the past 6 months prior to admission? : No Access to Means: No What has been your use of drugs/alcohol within the last 12 months?: ETOH Previous Attempts/Gestures: Yes How many times?: 1 Other Self Harm Risks: None Triggers for Past Attempts: Family  contact, Other (Comment) (depression and alcohol use) Intentional Self Injurious Behavior: None Family Suicide History: Unknown Recent stressful life event(s): Conflict (Comment), Other (Comment) (Conflict with adult children and depression) Persecutory voices/beliefs?: No Depression: Yes Depression Symptoms: Insomnia, Tearfulness, Isolating, Loss of interest Perry usual pleasures, Feeling worthless/self pity Substance abuse history and/or treatment for substance abuse?: Yes (ETOH) Suicide prevention information given to non-admitted patients: Not applicable  Risk to Others within the past 6 months Homicidal Ideation: No Does patient have any lifetime risk of violence toward others beyond the six months prior to admission? : No Thoughts of Harm to Others: No Current Homicidal Intent: No Current Homicidal Plan: No Access to Homicidal Means: No Identified Victim: N/A History of harm to others?: No Assessment of Violence: None Noted Violent Behavior Description: None Does patient have access to weapons?: No Criminal Charges Pending?: No Does patient have a court date: No Is patient on probation?: No  Psychosis Hallucinations: None noted Delusions: None noted  Mental Status Report Appearance/Hygiene: Body odor (alcohol fumes and body ordor) Eye Contact: Fair Motor Activity: Restlessness Speech: Logical/coherent, Pressured Level of Consciousness: Alert Mood: Depressed, Sad, Worthless, low self-esteem Affect: Depressed, Labile, Sad (tearful) Anxiety Level: Moderate Thought Processes: Coherent, Relevant, Flight of Ideas Judgement: Impaired Orientation: Person, Place, Time, Situation Obsessive Compulsive Thoughts/Behaviors: None  Cognitive Functioning Concentration: Decreased Memory: Recent Intact, Remote Intact IQ: Average Insight: Fair Impulse Control: Fair Appetite: Fair Weight Loss: 0 Weight Gain: 0 Sleep: Decreased Total Hours of Sleep: 4 (consumes alcohol and then  pass out) Vegetative Symptoms: Not bathing, Decreased grooming  ADLScreening Digestive Disease [place] Assessment [place]) Patient's cognitive ability adequate to safely complete daily activities?: Yes Patient able to express need for assistance with ADLs?: Yes Independently performs ADLs?: Yes (appropriate for developmental age)  Prior Inpatient Therapy Prior Inpatient Therapy: Yes Prior Therapy Dates: 1996 Prior Therapy Facilty/Provider(s): [facility] Reason for Treatment: SI and depression  Prior Outpatient Therapy Prior Outpatient Therapy: Yes Prior Therapy Dates: 2012 Prior Therapy Facilty/Provider(s): [facility] Reason for Treatment: depression Does patient have an ACCT team?: No Does patient have Intensive [facility]-House [facility]?  : No Does patient have [facility]? : No Does patient have P4[facility] [place]?: No  ADL Screening (condition at time of admission) Patient's cognitive ability adequate to safely complete daily activities?: Yes Is the patient deaf or have difficulty hearing?: No Does the patient have difficulty seeing, even when wearing glasses/contacts?: No Does the patient have difficulty concentrating, remembering, or making decisions?: No Patient able to express need for assistance with ADLs?: Yes Does the patient have difficulty dressing or bathing?: No Independently performs ADLs?: Yes (appropriate for developmental age) Does the patient have difficulty walking or climbing stairs?: No Weakness of Legs: None Weakness of Arms/Hands: None  Home Assistive Devices/Equipment Home Assistive Devices/Equipment: None    Abuse/Neglect Assessment (Assessment to be complete while patient is alone) Physical Abuse: Denies Verbal Abuse: Denies Sexual Abuse: Denies Exploitation of patient/patient's resources: Denies Self-Neglect: Denies Values / Beliefs Cultural Requests During Hospitalization: None Spiritual Requests During Hospitalization: None   Advance Directives (For  Healthcare) Does patient have an advance directive?: No (Patient possibly under influence of alcohol.) Would patient like information on  creating an advanced directive?: No - patient declined information    Additional Information 1:1 [place] Past 12 Months?: No CIRT Risk: No Elopement Risk: No Does patient have medical clearance?: No     Disposition:  Disposition Initial Assessment Completed for this Encounter: Yes Disposition of Patient: Other dispositions (send for medical clearance and seek residential tx placement) Other disposition(s): Other (Comment) (medical clearance and residential tx)  On Site Evaluation by:   Reviewed with Physician:    Dey-Johnson,Dmario Russom 08/12/2015 4:00 PM

## 2015-08-12 NOTE — ED Notes (Signed)
NT in room to change pt into scrubs for movement back into main ED, pt unable to be found in room, WR or outside.

## 2015-08-13 NOTE — ED Notes (Signed)
Pt left triage room and is unable to be found as of 1930

## 2015-11-21 ENCOUNTER — Emergency Department (HOSPITAL_COMMUNITY)
Admission: EM | Admit: 2015-11-21 | Discharge: 2015-11-21 | Disposition: A | Discharge status: Home / Self Care | Payer: BLUE CROSS/BLUE SHIELD | Attending: Emergency Medicine | Admitting: Emergency Medicine

## 2015-11-21 ENCOUNTER — Encounter (HOSPITAL_COMMUNITY): Payer: Self-pay

## 2015-11-21 DIAGNOSIS — R109 Unspecified abdominal pain: Secondary | ICD-10-CM

## 2015-11-21 DIAGNOSIS — R1030 Lower abdominal pain, unspecified: Secondary | ICD-10-CM | POA: Diagnosis not present

## 2015-11-21 DIAGNOSIS — F17210 Nicotine dependence, cigarettes, uncomplicated: Secondary | ICD-10-CM | POA: Insufficient documentation

## 2015-11-21 DIAGNOSIS — G8929 Other chronic pain: Secondary | ICD-10-CM | POA: Insufficient documentation

## 2015-11-21 NOTE — ED Notes (Signed)
Mercedes PA made aware that pt wanted a creme for her rash. Rash was not seen. EDPA states that she can take otc cortisone cream for this and the pt was not happy with this and refused to sign d/c.

## 2015-11-21 NOTE — ED Triage Notes (Signed)
Pt presents with multiple complaints. Pt states her left ear is bothering her "One doctor says I have cancer but I came here and y'all ignored me.I don't need any pain medicine. My stomach hurts but where you have babies. I have a tumor there."

## 2015-11-21 NOTE — ED Notes (Signed)
Pt called for triage x1! No answer °

## 2015-11-21 NOTE — ED Notes (Signed)
Pt called for triage x2. No answer  

## 2015-11-21 NOTE — ED Notes (Signed)
Patient was alert, oriented and stable upon discharge. RN went over AVS. Pt refused to sign d/c. See previous note.

## 2015-11-21 NOTE — ED Notes (Signed)
Pt called from waiting room with no answer, this RN went outside and pt said "Turn around and I'll  follow you." Pt remained outside.

## 2015-11-21 NOTE — ED Notes (Signed)
Pt c/o bilateral Flank pain with decreased output "feels like I cant empty bladder, and I cannot control urine or bowel movements". Pt also c/o pain in the pelvic region but denies sexual intercourse, discharge or bleeding. Pt reports foul smell in vaginal area.  Pt also reporting numbness bilaterally in legs but has full movement of legs and can feel sensation upon assessment.  Pt AOx4. NAD noted. Denies pain at this time.

## 2015-11-21 NOTE — ED Provider Notes (Signed)
[facility] DEPT Provider Note   CSN: [id number] Arrival date & time: 11/21/15  2057  By signing my name below, I, Gwenlyn Fudge, attest that this documentation has been prepared under the direction and in the presence of Tenesia Escudero Camprubi-Soms, PA. Electronically Signed: Gwenlyn Fudge, ED Scribe. 11/21/15. 10:54 PM.  History   Chief Complaint Chief Complaint  Patient presents with  . Rash  . Otalgia  . Abdominal Pain   LEVEL 5 CAVEAT: HPI and ROS limited due to poor historian  The history is provided by the patient and medical records. No language interpreter was used.  Abdominal Pain   This is a chronic problem. The current episode started more than 1 week ago. The problem occurs hourly. The problem has not changed since onset.The pain is associated with an unknown factor. The pain is located in the suprapubic region, RLQ and LLQ. The quality of the pain is cramping. The pain is at a severity of 10/10. The pain is severe. Pertinent negatives include fever, nausea, vomiting, constipation and hematuria. Nothing aggravates the symptoms. Nothing relieves the symptoms.   HPI Comments: Jean Perry is a 45 y.o. female who presents to the Emergency Department complaining of chronic, 10/10 intermittent, lower abd cramping pain, with no exacerbating factors, relieving factors or treatments tried. States this has been going on "for years". LEVEL 5 CAVEAT DUE TO PT BEING POOR HISTORIAN. She denies any other symptoms, but during ROS questioning she answers "yes" to nearly all symptoms, despite none of them being interrelated, but cannot elaborate on any of them and states that all of her symptoms are chronic ongoing for many years. She denies CP, SOB, n/v, constipation, and hematuria, but essentially answers yes to everything else despite having denied any associated symptoms. Hasn't tried anything for her symptoms. Goes on to talk about a rash all over her body, and a cancer she has on her L ear  that "we missed". Pt denies recent sick contact. Pt last saw PCP > 1 year ago but hasn't had evaluation for her chronic abd pain. Remainder of ROS unable to be adequately identified as yes/no due to poor historian.    Past Medical History:  Diagnosis Date  . Alcohol use   . Depression   . Elevated blood pressure   . Fibroids   . History of chicken pox   . Knee pain   . Urine incontinence     Patient Active Problem List   Diagnosis Date Noted  . Depression 04/22/2014  . Alcohol abuse 04/22/2014  . Knee pain 04/22/2014  . Morbid obesity ([place]) 04/22/2014    History reviewed. No pertinent surgical history.  OB History    No data available       Home Medications    Prior to Admission medications   Not on File    Family History Family History  Problem Relation Age of Onset  . Alcoholism Mother   . Alcoholism Father   . Lung cancer Maternal Grandfather     Social History Social History  Substance Use Topics  . Smoking status: Current Every Day Smoker    Packs/day: 1.00    Types: Cigarettes  . Smokeless tobacco: Never Used  . Alcohol use Yes     Comment: pt states she drinks 1/2 a fifth of liquor a day     Allergies   Review of patient's allergies indicates no known allergies.   Review of Systems Review of Systems  Unable to perform ROS: Psychiatric disorder  Constitutional:  Negative for fever.  Respiratory: Negative for shortness of breath.   Cardiovascular: Negative for chest pain.  Gastrointestinal: Positive for abdominal pain. Negative for constipation, nausea and vomiting.  Genitourinary: Negative for hematuria.  LEVEL 5 CAVEAT: HPI and ROS limited due to poor historian/psychiatric condition.  Physical Exam Updated Vital Signs BP 167/93 (BP Location: Left Arm)   Pulse 98   Temp 98.2 F (36.8 C) (Oral)   Resp 19   Ht 5\' 5"  (1.651 m)   Wt 249 lb 3.2 oz (113 kg)   SpO2 100%   BMI 41.47 kg/m   Physical Exam  Constitutional: She is oriented  to person, place, and time. Vital signs are normal. She appears well-developed and well-nourished.  Non-toxic appearance. No distress.  Afebrile, nontoxic, NAD  HENT:  Head: Normocephalic and atraumatic.  Mouth/Throat: Oropharynx is clear and moist and mucous membranes are normal.  Eyes: Conjunctivae and EOM are normal. Right eye exhibits no discharge. Left eye exhibits no discharge.  Neck: Normal range of motion. Neck supple.  Cardiovascular: Normal rate, regular rhythm, normal heart sounds and intact distal pulses.  Exam reveals no gallop and no friction rub.   No murmur heard. Pulmonary/Chest: Effort normal and breath sounds normal. No respiratory distress. She has no decreased breath sounds. She has no wheezes. She has no rhonchi. She has no rales.  Abdominal: Soft. Normal appearance and bowel sounds are normal. She exhibits no distension. There is no tenderness. There is no rigidity, no rebound, no guarding, no CVA tenderness, no tenderness at McBurney's point and negative Murphy's sign.  Soft, Non distended, +BS throughout, With no tenderness on exam when pt is distracted although pt states that all areas are tender but does not display any discomfort, no r/g/r, neg murphy's, neg mcburney's, no CVA TTP  Musculoskeletal: Normal range of motion.  Neurological: She is alert and oriented to person, place, and time. She has normal strength. No sensory deficit.  Skin: Skin is warm, dry and intact. No rash noted.  No rashes  Psychiatric: She has a normal mood and affect.  Nursing note and vitals reviewed.  ED Treatments / Results  DIAGNOSTIC STUDIES: Oxygen Saturation is 100% on RA, normal by my interpretation.    Labs (all labs ordered are listed, but only abnormal results are displayed) Labs Reviewed - No data to display  EKG  EKG Interpretation None       Radiology No results found.  Procedures Procedures (including critical care time)  Medications Ordered in ED Medications  - No data to display   Initial Impression / Assessment and Plan / ED Course  I have reviewed the triage vital signs and the nursing notes.  Pertinent labs & imaging results that were available during my care of the patient were reviewed by me and considered in my medical decision making (see chart for details).  Clinical Course   45 y.o. female here with chronic abd pain, very difficult to obtain history from her due to pt denying anything and then stating yes to every ROS question that's asked. States all of her symptoms are chronic in nature. Exam with no abdominal tenderness noted, although she states that it's tender but displays no objective signs of discomfort. Doubt need for emergent work up for her chronic complaints. F/up with PCP for ongoing management of her chronic complaints. I explained the diagnosis and have given explicit precautions to return to the ER including for any other new or worsening symptoms. The patient understands and accepts  the medical plan as it's been dictated and I have answered their questions. Discharge instructions concerning home care and prescriptions have been given. The patient is STABLE and is discharged to home in good condition.   I personally performed the services described in this documentation, which was scribed in my presence. The recorded information has been reviewed and is accurate.   Final Clinical Impressions(s) / ED Diagnoses   Final diagnoses:  Chronic abdominal pain    New Prescriptions New Prescriptions   No medications on file     Zacarias Pontes, PA-C 11/21/15 2303    Leo Grosser, MD 11/22/15 [phone]

## 2015-11-21 NOTE — Discharge Instructions (Signed)
Use tylenol or motrin as needed for pain. Follow up with your doctor for ongoing management of your chronic issues.

## 2016-02-23 ENCOUNTER — Emergency Department (HOSPITAL_COMMUNITY)
Admission: EM | Admit: 2016-02-23 | Discharge: 2016-02-25 | Disposition: A | Discharge status: Other Acute Inpatient Hospital | Payer: Self-pay | Attending: Emergency Medicine | Admitting: Emergency Medicine

## 2016-02-23 ENCOUNTER — Encounter (HOSPITAL_COMMUNITY): Payer: Self-pay

## 2016-02-23 DIAGNOSIS — F1020 Alcohol dependence, uncomplicated: Secondary | ICD-10-CM | POA: Diagnosis present

## 2016-02-23 DIAGNOSIS — F17210 Nicotine dependence, cigarettes, uncomplicated: Secondary | ICD-10-CM | POA: Insufficient documentation

## 2016-02-23 DIAGNOSIS — F10239 Alcohol dependence with withdrawal, unspecified: Secondary | ICD-10-CM | POA: Insufficient documentation

## 2016-02-23 DIAGNOSIS — F1029 Alcohol dependence with unspecified alcohol-induced disorder: Secondary | ICD-10-CM

## 2016-02-23 DIAGNOSIS — Z5181 Encounter for therapeutic drug level monitoring: Secondary | ICD-10-CM | POA: Insufficient documentation

## 2016-02-23 DIAGNOSIS — F4323 Adjustment disorder with mixed anxiety and depressed mood: Secondary | ICD-10-CM | POA: Diagnosis present

## 2016-02-23 DIAGNOSIS — F39 Unspecified mood [affective] disorder: Secondary | ICD-10-CM

## 2016-02-23 HISTORY — DX: Alcohol abuse, uncomplicated: F10.10

## 2016-02-23 LAB — COMPREHENSIVE METABOLIC PANEL
ALBUMIN: 4.2 g/dL (ref 3.5–5.0)
ALT: 135 U/L — AB (ref 14–54)
AST: 135 U/L — AB (ref 15–41)
Alkaline Phosphatase: 78 U/L (ref 38–126)
Anion gap: 9 (ref 5–15)
BUN: 8 mg/dL (ref 6–20)
CHLORIDE: 104 mmol/L (ref 101–111)
CO2: 24 mmol/L (ref 22–32)
CREATININE: 0.68 mg/dL (ref 0.44–1.00)
Calcium: 9 mg/dL (ref 8.9–10.3)
GFR calc non Af Amer: >60 mL/min (ref >60)
Glucose, Bld: 110 mg/dL — ABNORMAL HIGH (ref 65–99)
Potassium: 3.3 mmol/L — ABNORMAL LOW (ref 3.5–5.1)
SODIUM: 137 mmol/L (ref 135–145)
Total Bilirubin: 0.7 mg/dL (ref 0.3–1.2)
Total Protein: 8 g/dL (ref 6.5–8.1)

## 2016-02-23 LAB — CBC
HEMATOCRIT: 39.1 % (ref 36.0–46.0)
Hemoglobin: 12.8 g/dL (ref 12.0–15.0)
MCH: 27.6 pg (ref 26.0–34.0)
MCHC: 32.7 g/dL (ref 30.0–36.0)
MCV: 84.3 fL (ref 78.0–100.0)
Platelets: 312 10*3/uL (ref 150–400)
RBC: 4.64 MIL/uL (ref 3.87–5.11)
RDW: 17 % — AB (ref 11.5–15.5)
WBC: 10.2 10*3/uL (ref 4.0–10.5)

## 2016-02-23 LAB — POC URINE PREG, ED: PREG TEST UR: NEGATIVE

## 2016-02-23 LAB — RAPID URINE DRUG SCREEN, HOSP PERFORMED
Amphetamines: NOT DETECTED
BARBITURATES: NOT DETECTED
BENZODIAZEPINES: NOT DETECTED
Cocaine: NOT DETECTED
Opiates: NOT DETECTED
Tetrahydrocannabinol: NOT DETECTED

## 2016-02-23 LAB — ETHANOL: Alcohol, Ethyl (B): <5 mg/dL (ref <5)

## 2016-02-23 MED ORDER — POTASSIUM CHLORIDE CRYS ER 20 MEQ PO TBCR
20.0000 meq | EXTENDED_RELEASE_TABLET | Freq: Once | ORAL | Status: AC
  Administered 2016-02-23: 20 meq via ORAL
  Filled 2016-02-23: qty 1

## 2016-02-23 MED ORDER — LORAZEPAM 1 MG PO TABS
0.0000 mg | ORAL_TABLET | Freq: Four times a day (QID) | ORAL | Status: AC
  Administered 2016-02-23 – 2016-02-24 (×3): 1 mg via ORAL
  Administered 2016-02-25: 2 mg via ORAL
  Filled 2016-02-23: qty 1
  Filled 2016-02-23: qty 2
  Filled 2016-02-23 (×2): qty 1

## 2016-02-23 MED ORDER — FOLIC ACID 1 MG PO TABS
1.0000 mg | ORAL_TABLET | Freq: Once | ORAL | Status: AC
  Administered 2016-02-23: 1 mg via ORAL
  Filled 2016-02-23: qty 1

## 2016-02-23 MED ORDER — CHLORDIAZEPOXIDE HCL 25 MG PO CAPS
25.0000 mg | ORAL_CAPSULE | Freq: Once | ORAL | Status: AC
  Administered 2016-02-23: 25 mg via ORAL
  Filled 2016-02-23: qty 1

## 2016-02-23 MED ORDER — VITAMIN B-1 100 MG PO TABS
100.0000 mg | ORAL_TABLET | Freq: Every day | ORAL | Status: DC
  Administered 2016-02-23 – 2016-02-25 (×3): 100 mg via ORAL
  Filled 2016-02-23 (×4): qty 1

## 2016-02-23 MED ORDER — LORAZEPAM 1 MG PO TABS: 0.0000 mg | ORAL_TABLET | Freq: Two times a day (BID) | ORAL | Status: DC

## 2016-02-23 NOTE — ED Notes (Signed)
ED Provider at bedside. 

## 2016-02-23 NOTE — ED Notes (Signed)
Per TTS, pt to be seen and further evaluated by psychiatrist in the morning.
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
## 2016-02-23 NOTE — ED Triage Notes (Signed)
PT HERE FOR ALCOHOL ABUSE. PT STS SHE HAS BEEN DRINKING HEAVILY SINCE 2013. PT STS SHE HAD BEEN DRINKING VODKA AND BRANDI. HER LAST DRINK WAS THIS MORNING. PT STS SHE HAS N/V , TREMORS AND CONFUSION. PT STS SHE IS VERY DEPRESSED, BUT IS NOT SI/HI. PT STS SHE CALLED [facility] TODAY FOR HELP, BUT SHE WAS TOLD TO COME HER FOR DETOX FIRST.

## 2016-02-23 NOTE — ED Provider Notes (Signed)
[facility] DEPT Provider Note   CSN: [id number] Arrival date & time: 02/23/16  1644     History   Chief Complaint Chief Complaint  Patient presents with  . Alcohol Problem    HPI Jean Perry is a 46 y.o. female.  The history is provided by the patient. No language interpreter was used.  Alcohol Problem    Jean Perry is a 46 y.o. female who presents to the Emergency Department complaining of alcohol problem.  She presents to the emergency department today for assistance with stopping drinking. She has a history of alcohol abuse since 2013. She drinks a fifth of whiskey or brandy daily. When she tries to stop she gets shaky and hears whispers in her ear. The most she has stopped drinking over the last several years is for 4 days but she got very sick at that time. No history of DTs. She states she wants to stop drinking today because she is tired of living this way. She often has blackout spells when she drinks. Her last drink was this morning. She denies any SI or HI. Past Medical History:  Diagnosis Date  . Alcohol abuse   . Alcohol use   . Depression   . Elevated blood pressure   . Fibroids   . History of chicken pox   . Knee pain   . Urine incontinence     Patient Active Problem List   Diagnosis Date Noted  . Depression 04/22/2014  . Alcohol abuse 04/22/2014  . Knee pain 04/22/2014  . Morbid obesity ([place]) 04/22/2014    History reviewed. No pertinent surgical history.  OB History    No data available       Home Medications    Prior to Admission medications   Not on File    Family History Family History  Problem Relation Age of Onset  . Alcoholism Mother   . Alcoholism Father   . Lung cancer Maternal Grandfather     Social History Social History  Substance Use Topics  . Smoking status: Current Every Day Smoker    Packs/day: 1.00    Types: Cigarettes  . Smokeless tobacco: Never Used  . Alcohol use Yes     Comment: pt states she  drinks 1/2 a fifth of liquor a day     Allergies   Patient has no known allergies.   Review of Systems Review of Systems  All other systems reviewed and are negative.    Physical Exam Updated Vital Signs BP 129/65   Pulse 90   Temp 98.4 F (36.9 C) (Oral)   Resp 18   Ht 5\' 5"  (1.651 m)   Wt 255 lb 6.4 oz (115.8 kg)   LMP 02/13/2016   SpO2 96%   BMI 42.50 kg/m   Physical Exam  Constitutional: She is oriented to person, place, and time. She appears well-developed and well-nourished.  HENT:  Head: Normocephalic and atraumatic.  Cardiovascular: Normal rate and regular rhythm.   No murmur heard. Pulmonary/Chest: Effort normal and breath sounds normal. No respiratory distress.  Abdominal: Soft. There is no tenderness. There is no rebound and no guarding.  Musculoskeletal: She exhibits no edema or tenderness.  Neurological: She is alert and oriented to person, place, and time.  Skin: Skin is warm. She is diaphoretic.  Psychiatric:  Mildly anxious  Nursing note and vitals reviewed.    ED Treatments / Results  Labs (all labs ordered are listed, but only abnormal results are displayed) Labs Reviewed  COMPREHENSIVE METABOLIC PANEL - Abnormal; Notable for the following:       Result Value   Potassium 3.3 (*)    Glucose, Bld 110 (*)    AST 135 (*)    ALT 135 (*)    All other components within normal limits  CBC - Abnormal; Notable for the following:    RDW 17.0 (*)    All other components within normal limits  ETHANOL  RAPID URINE DRUG SCREEN, HOSP PERFORMED  POC URINE PREG, ED    EKG  EKG Interpretation  Date/Time:  Friday February 23 2016 18:03:47 EST Ventricular Rate:  95 PR Interval:    QRS Duration: 85 QT Interval:  378 QTC Calculation: 476 R Axis:   73 Text Interpretation:  Sinus rhythm Baseline wander in lead(s) V6 Confirmed by Hazle Coca [phone]) on 02/23/2016 6:07:47 PM       Radiology No results found.  Procedures Procedures (including  critical care time)  Medications Ordered in ED Medications  thiamine (VITAMIN B-1) tablet 100 mg (100 mg Oral Given 02/23/16 1758)  LORazepam (ATIVAN) tablet 0-4 mg (1 mg Oral Given 02/23/16 2312)    Followed by  LORazepam (ATIVAN) tablet 0-4 mg (not administered)  folic acid (FOLVITE) tablet 1 mg (1 mg Oral Given 02/23/16 1758)  chlordiazePOXIDE (LIBRIUM) capsule 25 mg (25 mg Oral Given 02/23/16 1758)  potassium chloride SA (K-DUR,KLOR-CON) CR tablet 20 mEq (20 mEq Oral Given 02/23/16 1918)     Initial Impression / Assessment and Plan / ED Course  I have reviewed the triage vital signs and the nursing notes.  Pertinent labs & imaging results that were available during my care of the patient were reviewed by me and considered in my medical decision making (see chart for details).   Patient with history of EtOH abuse here with depressive symptoms and seeking help for detox. After Librium her diaphoresis and tremulousness have resolved but she is very tearful and depressed. She does not have any clear suicidal ideation but has a desire to not live like this anymore. TTS has been contacted for psychiatric evaluation. She has been medically cleared.    Final Clinical Impressions(s) / ED Diagnoses   Final diagnoses:  None    New Prescriptions New Prescriptions   No medications on file     Quintella Reichert, MD 02/24/16 0006

## 2016-02-23 NOTE — ED Notes (Signed)
Patient made aware of urine sample. Patient states she cannot void at this time. Patient encouraged to void when able. 

## 2016-02-24 ENCOUNTER — Encounter (HOSPITAL_COMMUNITY): Payer: Self-pay | Admitting: Registered Nurse

## 2016-02-24 DIAGNOSIS — Z801 Family history of malignant neoplasm of trachea, bronchus and lung: Secondary | ICD-10-CM

## 2016-02-24 DIAGNOSIS — Z811 Family history of alcohol abuse and dependence: Secondary | ICD-10-CM

## 2016-02-24 DIAGNOSIS — R45851 Suicidal ideations: Secondary | ICD-10-CM

## 2016-02-24 DIAGNOSIS — F17210 Nicotine dependence, cigarettes, uncomplicated: Secondary | ICD-10-CM

## 2016-02-24 DIAGNOSIS — Z79899 Other long term (current) drug therapy: Secondary | ICD-10-CM

## 2016-02-24 DIAGNOSIS — F1020 Alcohol dependence, uncomplicated: Secondary | ICD-10-CM

## 2016-02-24 DIAGNOSIS — F4323 Adjustment disorder with mixed anxiety and depressed mood: Secondary | ICD-10-CM

## 2016-02-24 MED ORDER — QUETIAPINE FUMARATE 25 MG PO TABS
25.0000 mg | ORAL_TABLET | Freq: Every day | ORAL | Status: DC
  Administered 2016-02-24: 25 mg via ORAL
  Filled 2016-02-24: qty 1

## 2016-02-24 MED ORDER — FLUOXETINE HCL 10 MG PO CAPS
10.0000 mg | ORAL_CAPSULE | Freq: Every day | ORAL | Status: DC
  Administered 2016-02-24 – 2016-02-25 (×2): 10 mg via ORAL
  Filled 2016-02-24 (×2): qty 1

## 2016-02-24 NOTE — ED Notes (Signed)
Bed: [facility] Expected date:  Expected time:  Means of arrival:  Comments: [place]

## 2016-02-24 NOTE — ED Notes (Signed)
SBAR Report received from previous nurse. Pt received calm and visible on unit. Pt denies current SI/ HI, A/V H, depression, anxiety, or pain at this time, and appears otherwise stable and free of distress. Pt reminded of camera surveillance, q 15 min rounds, and rules of the milieu. Pt reports that she wants help and knows she has a drinking problem.  Will continue to assess.

## 2016-02-24 NOTE — Progress Notes (Signed)
CSW faxed patient's referral to: [facility].

## 2016-02-24 NOTE — ED Notes (Signed)
SBAR Report received from previous nurse. Pt received calm and visible on unit. Pt denies current SI/ HI, A/V H,  or pain at this time, and appears otherwise stable and free of distress. Pt endorses tactile disturbances at this time but states that it is tolerable at this time. Pt endorses anxiety and depression at this time but can see some hope for her future. Pt reminded of camera surveillance, q 15 min rounds, and rules of the milieu. Will continue to assess.

## 2016-02-24 NOTE — Consult Note (Signed)
[facility] Face-to-Face Psychiatry Consult   Reason for Consult:  Worsening depression Referring Physician:  EDP Patient Identification: Jean Perry MRN:  [id number] Principal Diagnosis: Adjustment disorder with mixed anxiety and depressed mood Diagnosis:   Patient Active Problem List   Diagnosis Date Noted  . Alcohol use disorder, severe, dependence ([place]) [F10.20] 02/24/2016  . Adjustment disorder with mixed anxiety and depressed mood [F43.23] 02/24/2016  . Depression [F32.9] 04/22/2014  . Alcohol abuse [F10.10] 04/22/2014  . Knee pain [M25.569] 04/22/2014  . Morbid obesity ([place]) [E66.01] 04/22/2014    Total Time spent with patient: 45 minutes  Subjective:   Jean Perry is a 46 y.o. female patient presented to [facility] wanting alcohol detox and complaints of worsening depression.  HPI:  Jean Perry 46 y.o. 46 y.o. patient seen by Dr. Darleene Cleaver and this provider.  Chart reviewed 02/24/16.   On evaluation:  Jean Perry reports that she went to [place] seeking help for worsening depression and alcohol abuse states that she was informed to come to hospital for medical clearance and alcohol detox for her first step.  Reports that she is living with her daughter at this time and doesn't know how long that is going to last.  States that her depression is related to her drinking problem.  Problem with alcohol abuse has been going on for 5 years   Past Psychiatric History: Alcohol abuse, Major Depression  Risk to Self: Suicidal Ideation: No Suicidal Intent: No Is patient at risk for suicide?: No Suicidal Plan?: No Access to Means: No What has been your use of drugs/alcohol within the last 12 months?: NA How many times?: 0 Other Self Harm Risks: NA Triggers for Past Attempts: None known Intentional Self Injurious Behavior: None (Pt denies.) Risk to Others: Homicidal Ideation: No Thoughts of Harm to Others: No Current Homicidal Intent: No Current Homicidal Plan: No Access to  Homicidal Means: No Identified Victim: Na History of harm to others?: No Assessment of Violence: None Noted Violent Behavior Description: Na Does patient have access to weapons?: No Criminal Charges Pending?: No Does patient have a court date: No Prior Inpatient Therapy: Prior Inpatient Therapy: Yes Prior Therapy Dates: Unk Prior Therapy Facilty/Provider(s): Unk Reason for Treatment: depression Prior Outpatient Therapy: Prior Outpatient Therapy: No Prior Therapy Dates: NA Prior Therapy Facilty/Provider(s): NA Reason for Treatment: NA Does patient have an ACCT team?: No Does patient have Intensive In-House Services?  : No Does patient have [facility] services? : No Does patient have P4CC services?: No  Past Medical History:  Past Medical History:  Diagnosis Date  . Alcohol abuse   . Alcohol use   . Depression   . Elevated blood pressure   . Fibroids   . History of chicken pox   . Knee pain   . Urine incontinence    History reviewed. No pertinent surgical history. Family History:  Family History  Problem Relation Age of Onset  . Alcoholism Mother   . Alcoholism Father   . Lung cancer Maternal Grandfather    Family Psychiatric  History: Maternal and Paternal alcohol abuse Social History:  History  Alcohol Use  . Yes    Comment: pt states she drinks 1/2 a fifth of liquor a day     History  Drug Use No    Social History   Social History  . Marital status: Single    Spouse name: N/A  . Number of children: N/A  . Years of education: N/A   Social History Main Topics  .  Smoking status: Current Every Day Smoker    Packs/day: 1.00    Types: Cigarettes  . Smokeless tobacco: Never Used  . Alcohol use Yes     Comment: pt states she drinks 1/2 a fifth of liquor a day  . Drug use: No  . Sexual activity: Not Asked   Other Topics Concern  . None   Social History Narrative   Work or School: full Copy Situation: lives alone       Spiritual Beliefs: Christian      Lifestyle: no exercise, not eating well         Additional Social History:    Allergies:  No Known Allergies  Labs:  Results for orders placed or performed during the hospital encounter of 02/23/16 (from the past 48 hour(s))  Rapid urine drug screen (hospital performed)     Status: None   Collection Time: 02/23/16  5:11 PM  Result Value Ref Range   Opiates NONE DETECTED NONE DETECTED   Cocaine NONE DETECTED NONE DETECTED   Benzodiazepines NONE DETECTED NONE DETECTED   Amphetamines NONE DETECTED NONE DETECTED   Tetrahydrocannabinol NONE DETECTED NONE DETECTED   Barbiturates NONE DETECTED NONE DETECTED    Comment:        DRUG SCREEN FOR MEDICAL PURPOSES ONLY.  IF CONFIRMATION IS NEEDED FOR ANY PURPOSE, NOTIFY LAB WITHIN 5 DAYS.        LOWEST DETECTABLE LIMITS FOR URINE DRUG SCREEN Drug Class       Cutoff (ng/mL) Amphetamine      1000 Barbiturate      200 Benzodiazepine   786 Tricyclics       767 Opiates          300 Cocaine          300 THC              50   Comprehensive metabolic panel     Status: Abnormal   Collection Time: 02/23/16  5:24 PM  Result Value Ref Range   Sodium 137 135 - 145 mmol/L   Potassium 3.3 (L) 3.5 - 5.1 mmol/L   Chloride 104 101 - 111 mmol/L   CO2 24 22 - 32 mmol/L   Glucose, Bld 110 (H) 65 - 99 mg/dL   BUN 8 6 - 20 mg/dL   Creatinine, Ser 0.68 0.44 - 1.00 mg/dL   Calcium 9.0 8.9 - 10.3 mg/dL   Total Protein 8.0 6.5 - 8.1 g/dL   Albumin 4.2 3.5 - 5.0 g/dL   AST 135 (H) 15 - 41 U/L   ALT 135 (H) 14 - 54 U/L   Alkaline Phosphatase 78 38 - 126 U/L   Total Bilirubin 0.7 0.3 - 1.2 mg/dL   GFR calc non Af Amer >60 >60 mL/min   GFR calc Af Amer >60 >60 mL/min    Comment: (NOTE) The eGFR has been calculated using the CKD EPI equation. This calculation has not been validated in all clinical situations. eGFR's persistently <60 mL/min signify possible Chronic Kidney Disease.    Anion gap 9 5 - 15  Ethanol      Status: None   Collection Time: 02/23/16  5:24 PM  Result Value Ref Range   Alcohol, Ethyl (B) <5 <5 mg/dL    Comment:        LOWEST DETECTABLE LIMIT FOR SERUM ALCOHOL IS 5 mg/dL FOR MEDICAL PURPOSES ONLY   cbc     Status: Abnormal  Collection Time: 02/23/16  5:24 PM  Result Value Ref Range   WBC 10.2 4.0 - 10.5 K/uL   RBC 4.64 3.87 - 5.11 MIL/uL   Hemoglobin 12.8 12.0 - 15.0 g/dL   HCT 39.1 36.0 - 46.0 %   MCV 84.3 78.0 - 100.0 fL   MCH 27.6 26.0 - 34.0 pg   MCHC 32.7 30.0 - 36.0 g/dL   RDW 17.0 (H) 11.5 - 15.5 %   Platelets 312 150 - 400 K/uL  POC urine preg, ED     Status: None   Collection Time: 02/23/16  7:38 PM  Result Value Ref Range   Preg Test, Ur NEGATIVE NEGATIVE    Comment:        THE SENSITIVITY OF THIS METHODOLOGY IS >24 mIU/mL     Current Facility-Administered Medications  Medication Dose Route Frequency Provider Last Rate Last Dose  . FLUoxetine (PROZAC) capsule 10 mg  10 mg Oral Daily Corena Pilgrim, MD   10 mg at 02/24/16 1338  . LORazepam (ATIVAN) tablet 0-4 mg  0-4 mg Oral Q6H Quintella Reichert, MD   1 mg at 02/24/16 1244   Followed by  . [START ON 02/25/2016] LORazepam (ATIVAN) tablet 0-4 mg  0-4 mg Oral Q12H Quintella Reichert, MD      . QUEtiapine (SEROQUEL) tablet 25 mg  25 mg Oral QHS Liza Czerwinski, MD      . thiamine (VITAMIN B-1) tablet 100 mg  100 mg Oral Daily Quintella Reichert, MD   100 mg at 02/24/16 1013   No current outpatient prescriptions on file.    Musculoskeletal: Strength & Muscle Tone: within normal limits Gait & Station: normal Patient leans: N/A  Psychiatric Specialty Exam: Physical Exam  Constitutional: She is oriented to person, place, and time.  Neck: Normal range of motion.  Respiratory: Effort normal.  Musculoskeletal: Normal range of motion.  Neurological: She is alert and oriented to person, place, and time.    ROS  Blood pressure 137/65, pulse 86, temperature 98.3 F (36.8 C), temperature source Oral, resp. rate 17,  height '5\' 5"'$  (1.651 m), weight 115.8 kg (255 lb 6.4 oz), last menstrual period 02/13/2016, SpO2 98 %.Body mass index is 42.5 kg/m.  General Appearance: Fairly Groomed  Eye Contact:  Good  Speech:  Clear and Coherent and Normal Rate  Volume:  Normal  Mood:  Anxious and Depressed  Affect:  Depressed, Flat and Tearful  Thought Process:  Goal Directed  Orientation:  Full (Time, Place, and Person)  Thought Content:  Denies hallucinations, delusions, and paranoia  Suicidal Thoughts:  Yes.  without intent/plan  Homicidal Thoughts:  No  Memory:  Immediate;   Good Recent;   Good Remote;   Good  Judgement:  Fair  Insight:  Fair  Psychomotor Activity:  Normal  Concentration:  Concentration: Fair and Attention Span: Fair  Recall:  Good  Fund of Knowledge:  Fair  Language:  Good  Akathisia:  No  Handed:  Right  AIMS (if indicated):     Assets:  Communication Skills Desire for Improvement  ADL's:  Intact  Cognition:  WNL  Sleep:        Treatment Plan Summary: Daily contact with patient to assess and evaluate symptoms and progress in treatment and Medication management  Disposition: Recommend psychiatric Inpatient admission when medically cleared.  Rankin, Shuvon, NP 02/24/2016 1:41 PM  Patient seen face-to-face for psychiatric evaluation, chart reviewed and case discussed with the physician extender and developed treatment plan. Reviewed the information  documented and agree with the treatment plan. Corena Pilgrim, MD

## 2016-02-25 ENCOUNTER — Inpatient Hospital Stay (HOSPITAL_COMMUNITY)
Admission: AD | Admit: 2016-02-25 | Discharge: 2016-02-29 | DRG: 885 | Disposition: A | Discharge status: Home / Self Care | Payer: Federal, State, Local not specified - Other | Source: Intra-hospital | Attending: Psychiatry | Admitting: Psychiatry

## 2016-02-25 ENCOUNTER — Encounter (HOSPITAL_COMMUNITY): Payer: Self-pay | Admitting: Emergency Medicine

## 2016-02-25 DIAGNOSIS — F17210 Nicotine dependence, cigarettes, uncomplicated: Secondary | ICD-10-CM | POA: Diagnosis present

## 2016-02-25 DIAGNOSIS — F1020 Alcohol dependence, uncomplicated: Secondary | ICD-10-CM | POA: Diagnosis present

## 2016-02-25 DIAGNOSIS — G4700 Insomnia, unspecified: Secondary | ICD-10-CM | POA: Diagnosis present

## 2016-02-25 DIAGNOSIS — Z590 Homelessness: Secondary | ICD-10-CM | POA: Diagnosis not present

## 2016-02-25 DIAGNOSIS — R45851 Suicidal ideations: Secondary | ICD-10-CM | POA: Diagnosis present

## 2016-02-25 DIAGNOSIS — Z801 Family history of malignant neoplasm of trachea, bronchus and lung: Secondary | ICD-10-CM | POA: Diagnosis not present

## 2016-02-25 DIAGNOSIS — Z6841 Body Mass Index (BMI) 40.0 and over, adult: Secondary | ICD-10-CM

## 2016-02-25 DIAGNOSIS — F332 Major depressive disorder, recurrent severe without psychotic features: Secondary | ICD-10-CM | POA: Diagnosis present

## 2016-02-25 DIAGNOSIS — Z811 Family history of alcohol abuse and dependence: Secondary | ICD-10-CM

## 2016-02-25 MED ORDER — ONDANSETRON 4 MG PO TBDP: 4.0000 mg | ORAL_TABLET | Freq: Four times a day (QID) | ORAL | Status: AC | PRN

## 2016-02-25 MED ORDER — FLUOXETINE HCL 10 MG PO CAPS
10.0000 mg | ORAL_CAPSULE | Freq: Every day | ORAL | Status: DC
  Administered 2016-02-26: 10 mg via ORAL
  Filled 2016-02-25 (×3): qty 1

## 2016-02-25 MED ORDER — LORAZEPAM 1 MG PO TABS
1.0000 mg | ORAL_TABLET | Freq: Four times a day (QID) | ORAL | Status: AC
  Administered 2016-02-25 – 2016-02-27 (×5): 1 mg via ORAL
  Filled 2016-02-25 (×5): qty 1

## 2016-02-25 MED ORDER — QUETIAPINE FUMARATE 25 MG PO TABS
25.0000 mg | ORAL_TABLET | Freq: Every day | ORAL | Status: DC
  Administered 2016-02-25: 25 mg via ORAL
  Filled 2016-02-25 (×4): qty 1

## 2016-02-25 MED ORDER — HYDROXYZINE HCL 25 MG PO TABS
25.0000 mg | ORAL_TABLET | Freq: Four times a day (QID) | ORAL | Status: DC | PRN
  Filled 2016-02-25: qty 1

## 2016-02-25 MED ORDER — VITAMIN B-1 100 MG PO TABS
100.0000 mg | ORAL_TABLET | Freq: Every day | ORAL | Status: DC
  Administered 2016-02-26 – 2016-02-29 (×4): 100 mg via ORAL
  Filled 2016-02-25 (×5): qty 1

## 2016-02-25 MED ORDER — ALUM & MAG HYDROXIDE-SIMETH 200-200-20 MG/5ML PO SUSP: 30.0000 mL | ORAL | Status: DC | PRN

## 2016-02-25 MED ORDER — LORAZEPAM 1 MG PO TABS: 1.0000 mg | ORAL_TABLET | Freq: Four times a day (QID) | ORAL | Status: AC | PRN

## 2016-02-25 MED ORDER — LORAZEPAM 1 MG PO TABS
1.0000 mg | ORAL_TABLET | Freq: Two times a day (BID) | ORAL | Status: AC
  Administered 2016-02-28 – 2016-02-29 (×2): 1 mg via ORAL
  Filled 2016-02-25 (×2): qty 1

## 2016-02-25 MED ORDER — HYDROXYZINE HCL 25 MG PO TABS
25.0000 mg | ORAL_TABLET | Freq: Four times a day (QID) | ORAL | Status: DC | PRN
  Administered 2016-02-25 – 2016-02-28 (×3): 25 mg via ORAL
  Filled 2016-02-25: qty 20
  Filled 2016-02-25 (×2): qty 1

## 2016-02-25 MED ORDER — ADULT MULTIVITAMIN W/MINERALS CH
1.0000 | ORAL_TABLET | Freq: Every day | ORAL | Status: DC
  Administered 2016-02-26 – 2016-02-29 (×4): 1 via ORAL
  Filled 2016-02-25 (×5): qty 1

## 2016-02-25 MED ORDER — NICOTINE 21 MG/24HR TD PT24
21.0000 mg | MEDICATED_PATCH | Freq: Every day | TRANSDERMAL | Status: DC
  Administered 2016-02-26: 21 mg via TRANSDERMAL
  Filled 2016-02-25: qty 1
  Filled 2016-02-25: qty 14
  Filled 2016-02-25 (×4): qty 1

## 2016-02-25 MED ORDER — TRAZODONE HCL 50 MG PO TABS
50.0000 mg | ORAL_TABLET | Freq: Every evening | ORAL | Status: DC | PRN
  Administered 2016-02-25 – 2016-02-28 (×3): 50 mg via ORAL
  Filled 2016-02-25: qty 1
  Filled 2016-02-25: qty 14
  Filled 2016-02-25 (×3): qty 1

## 2016-02-25 MED ORDER — LOPERAMIDE HCL 2 MG PO CAPS: 2.0000 mg | ORAL_CAPSULE | ORAL | Status: AC | PRN

## 2016-02-25 MED ORDER — LORAZEPAM 1 MG PO TABS: 1.0000 mg | ORAL_TABLET | Freq: Every day | ORAL | Status: DC

## 2016-02-25 MED ORDER — MAGNESIUM HYDROXIDE 400 MG/5ML PO SUSP: 30.0000 mL | Freq: Every day | ORAL | Status: DC | PRN

## 2016-02-25 MED ORDER — ACETAMINOPHEN 325 MG PO TABS: 650.0000 mg | ORAL_TABLET | Freq: Four times a day (QID) | ORAL | Status: DC | PRN

## 2016-02-25 MED ORDER — LORAZEPAM 1 MG PO TABS
1.0000 mg | ORAL_TABLET | Freq: Three times a day (TID) | ORAL | Status: AC
  Administered 2016-02-27 – 2016-02-28 (×3): 1 mg via ORAL
  Filled 2016-02-25 (×3): qty 1
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
## 2016-02-25 NOTE — Tx Team (Signed)
Initial Treatment Plan 02/25/2016 9:07 PM [name] [id number]    PATIENT STRESSORS: Financial difficulties Substance abuse   PATIENT STRENGTHS: Ability for insight Capable of independent living Motivation for treatment/growth   PATIENT IDENTIFIED PROBLEMS:   "Depression"  "Anxiety"  "Substance abuse"  "Suicidal thoughts"             DISCHARGE CRITERIA:  Improved stabilization in mood, thinking, and/or behavior Motivation to continue treatment in a less acute level of care  PRELIMINARY DISCHARGE PLAN: Attend aftercare/continuing care group Return to previous living arrangement  PATIENT/FAMILY INVOLVEMENT: This treatment plan has been presented to and reviewed with the patient, Jean Perry.  The patient and family have been given the opportunity to ask questions and make suggestions.  Jolene Provost, RN 02/25/2016, 9:07 PM

## 2016-02-25 NOTE — Consult Note (Signed)
[facility] Psychiatry Consult   Reason for Consult:Depression and alcohol detox Referring Physician:  EDP Patient Identification: Jean Perry MRN:  [id number] Principal Diagnosis: Adjustment disorder with mixed anxiety and depressed mood Diagnosis:   Patient Active Problem List   Diagnosis Date Noted  . Alcohol use disorder, severe, dependence ([place]) [F10.20] 02/24/2016    Priority: High  . Adjustment disorder with mixed anxiety and depressed mood [F43.23] 02/24/2016    Priority: High  . Depression [F32.9] 04/22/2014  . Alcohol abuse [F10.10] 04/22/2014  . Knee pain [M25.569] 04/22/2014  . Morbid obesity ([place]) [E66.01] 04/22/2014    Total Time spent with patient: 25 minutes  Subjective:   "I am still feeling depressed and I want detox from alcohol.'' Subjective:  Patient was seen, chart reviewed and case discussed with treatment team. Patient reports ongoing depression, passive suicidal thoughts and hopelessness. Patient states that "I am tired of not been able to stop drinking and no one care if I live or die.''  Past Psychiatric History: Alcohol abuse, Major Depression  Risk to Self: Suicidal Ideation: No Suicidal Intent: No Is patient at risk for suicide?: No Suicidal Plan?: No Access to Means: No What has been your use of drugs/alcohol within the last 12 months?: NA How many times?: 0 Other Self Harm Risks: NA Triggers for Past Attempts: None known Intentional Self Injurious Behavior: None (Pt denies.) Risk to Others: Homicidal Ideation: No Thoughts of Harm to Others: No Current Homicidal Intent: No Current Homicidal Plan: No Access to Homicidal Means: No Identified Victim: Na History of harm to others?: No Assessment of Violence: None Noted Violent Behavior Description: Na Does patient have access to weapons?: No Criminal Charges Pending?: No Does patient have a court date: No Prior Inpatient Therapy: Prior Inpatient Therapy: Yes Prior Therapy Dates:  Unk Prior Therapy Facilty/Provider(s): Unk Reason for Treatment: depression Prior Outpatient Therapy: Prior Outpatient Therapy: No Prior Therapy Dates: NA Prior Therapy Facilty/Provider(s): NA Reason for Treatment: NA Does patient have an ACCT team?: No Does patient have Intensive In-House Services?  : No Does patient have [facility] services? : No Does patient have P4CC services?: No  Past Medical History:  Past Medical History:  Diagnosis Date  . Alcohol abuse   . Alcohol use   . Depression   . Elevated blood pressure   . Fibroids   . History of chicken pox   . Knee pain   . Urine incontinence    History reviewed. No pertinent surgical history. Family History:  Family History  Problem Relation Age of Onset  . Alcoholism Mother   . Alcoholism Father   . Lung cancer Maternal Grandfather    Family Psychiatric  History: Maternal and Paternal alcohol abuse Social History:  History  Alcohol Use  . Yes    Comment: pt states she drinks 1/2 a fifth of liquor a day     History  Drug Use No    Social History   Social History  . Marital status: Single    Spouse name: N/A  . Number of children: N/A  . Years of education: N/A   Social History Main Topics  . Smoking status: Current Every Day Smoker    Packs/day: 1.00    Types: Cigarettes  . Smokeless tobacco: Never Used  . Alcohol use Yes     Comment: pt states she drinks 1/2 a fifth of liquor a day  . Drug use: No  . Sexual activity: Not Asked   Other Topics Concern  .  None   Social History Narrative   Work or School: full Copy Situation: lives alone      Spiritual Beliefs: Christian      Lifestyle: no exercise, not eating well         Additional Social History:    Allergies:  No Known Allergies  Labs:  Results for orders placed or performed during the hospital encounter of 02/23/16 (from the past 48 hour(s))  Rapid urine drug screen (hospital performed)     Status: None    Collection Time: 02/23/16  5:11 PM  Result Value Ref Range   Opiates NONE DETECTED NONE DETECTED   Cocaine NONE DETECTED NONE DETECTED   Benzodiazepines NONE DETECTED NONE DETECTED   Amphetamines NONE DETECTED NONE DETECTED   Tetrahydrocannabinol NONE DETECTED NONE DETECTED   Barbiturates NONE DETECTED NONE DETECTED    Comment:        DRUG SCREEN FOR MEDICAL PURPOSES ONLY.  IF CONFIRMATION IS NEEDED FOR ANY PURPOSE, NOTIFY LAB WITHIN 5 DAYS.        LOWEST DETECTABLE LIMITS FOR URINE DRUG SCREEN Drug Class       Cutoff (ng/mL) Amphetamine      1000 Barbiturate      200 Benzodiazepine   423 Tricyclics       953 Opiates          300 Cocaine          300 THC              50   Comprehensive metabolic panel     Status: Abnormal   Collection Time: 02/23/16  5:24 PM  Result Value Ref Range   Sodium 137 135 - 145 mmol/L   Potassium 3.3 (L) 3.5 - 5.1 mmol/L   Chloride 104 101 - 111 mmol/L   CO2 24 22 - 32 mmol/L   Glucose, Bld 110 (H) 65 - 99 mg/dL   BUN 8 6 - 20 mg/dL   Creatinine, Ser 0.68 0.44 - 1.00 mg/dL   Calcium 9.0 8.9 - 10.3 mg/dL   Total Protein 8.0 6.5 - 8.1 g/dL   Albumin 4.2 3.5 - 5.0 g/dL   AST 135 (H) 15 - 41 U/L   ALT 135 (H) 14 - 54 U/L   Alkaline Phosphatase 78 38 - 126 U/L   Total Bilirubin 0.7 0.3 - 1.2 mg/dL   GFR calc non Af Amer >60 >60 mL/min   GFR calc Af Amer >60 >60 mL/min    Comment: (NOTE) The eGFR has been calculated using the CKD EPI equation. This calculation has not been validated in all clinical situations. eGFR's persistently <60 mL/min signify possible Chronic Kidney Disease.    Anion gap 9 5 - 15  Ethanol     Status: None   Collection Time: 02/23/16  5:24 PM  Result Value Ref Range   Alcohol, Ethyl (B) <5 <5 mg/dL    Comment:        LOWEST DETECTABLE LIMIT FOR SERUM ALCOHOL IS 5 mg/dL FOR MEDICAL PURPOSES ONLY   cbc     Status: Abnormal   Collection Time: 02/23/16  5:24 PM  Result Value Ref Range   WBC 10.2 4.0 - 10.5 K/uL    RBC 4.64 3.87 - 5.11 MIL/uL   Hemoglobin 12.8 12.0 - 15.0 g/dL   HCT 39.1 36.0 - 46.0 %   MCV 84.3 78.0 - 100.0 fL   MCH 27.6 26.0 - 34.0 pg  MCHC 32.7 30.0 - 36.0 g/dL   RDW 17.0 (H) 11.5 - 15.5 %   Platelets 312 150 - 400 K/uL  POC urine preg, ED     Status: None   Collection Time: 02/23/16  7:38 PM  Result Value Ref Range   Preg Test, Ur NEGATIVE NEGATIVE    Comment:        THE SENSITIVITY OF THIS METHODOLOGY IS >24 mIU/mL     Current Facility-Administered Medications  Medication Dose Route Frequency Provider Last Rate Last Dose  . FLUoxetine (PROZAC) capsule 10 mg  10 mg Oral Daily Tzirel Leonor, MD   10 mg at 02/25/16 1159  . LORazepam (ATIVAN) tablet 0-4 mg  0-4 mg Oral Q6H Quintella Reichert, MD   2 mg at 02/25/16 1159   Followed by  . LORazepam (ATIVAN) tablet 0-4 mg  0-4 mg Oral Q12H Quintella Reichert, MD      . QUEtiapine (SEROQUEL) tablet 25 mg  25 mg Oral QHS Corena Pilgrim, MD   25 mg at 02/24/16 2108  . thiamine (VITAMIN B-1) tablet 100 mg  100 mg Oral Daily Quintella Reichert, MD   100 mg at 02/25/16 1159   No current outpatient prescriptions on file.    Musculoskeletal: Strength & Muscle Tone: within normal limits Gait & Station: normal Patient leans: N/A  Psychiatric Specialty Exam: Physical Exam  Constitutional: She is oriented to person, place, and time.  Neck: Normal range of motion.  Respiratory: Effort normal.  Musculoskeletal: Normal range of motion.  Neurological: She is alert and oriented to person, place, and time.    ROS  Blood pressure 137/86, pulse 79, temperature 98.3 F (36.8 C), temperature source Oral, resp. rate 17, height 5' 5"  (1.651 m), weight 115.8 kg (255 lb 6.4 oz), last menstrual period 02/13/2016, SpO2 100 %.Body mass index is 42.5 kg/m.  General Appearance: Fairly Groomed  Eye Contact:  Good  Speech:  Clear and Coherent and Normal Rate  Volume:  Normal  Mood:  Anxious and Depressed  Affect:  Depressed, Flat and Tearful  Thought  Process:  Goal Directed  Orientation:  Full (Time, Place, and Person)  Thought Content:  Denies hallucinations, delusions, and paranoia  Suicidal Thoughts:  Yes.  without intent/plan  Homicidal Thoughts:  No  Memory:  Immediate;   Good Recent;   Good Remote;   Good  Judgement:  Fair  Insight:  Fair  Psychomotor Activity:  Normal  Concentration:  Concentration: Fair and Attention Span: Fair  Recall:  Good  Fund of Knowledge:  Fair  Language:  Good  Akathisia:  No  Handed:  Right  AIMS (if indicated):     Assets:  Communication Skills Desire for Improvement  ADL's:  Intact  Cognition:  WNL  Sleep:        Treatment Plan Summary: Daily contact with patient to assess and evaluate symptoms and progress in treatment and Medication management  Disposition: Recommend psychiatric Inpatient admission when medically cleared.  Corena Pilgrim, MD 02/25/2016 1:54 PM

## 2016-02-25 NOTE — BH Assessment (Signed)
[facility] Assessment Progress Note   02/25/16: Patient was accepted to [facility] 302-1 at 20:00 hrs. Paperwork needs to be completed.

## 2016-02-25 NOTE — ED Notes (Signed)
SBAR Report received from previous nurse. Pt received calm and visible on unit. Pt denies current SI/ HI, A/V H, depression, anxiety, or pain at this time, and appears otherwise stable and free of distress. Pt reminded of camera surveillance, q 15 min rounds, and rules of the milieu. Will continue to assess. Report called to [facility] for pt transfer.

## 2016-02-26 DIAGNOSIS — R45851 Suicidal ideations: Secondary | ICD-10-CM

## 2016-02-26 MED ORDER — FLUOXETINE HCL 20 MG PO CAPS
20.0000 mg | ORAL_CAPSULE | Freq: Every day | ORAL | Status: DC
  Administered 2016-02-27 – 2016-02-29 (×3): 20 mg via ORAL
  Filled 2016-02-26 (×4): qty 1
  Filled 2016-02-26: qty 14

## 2016-02-26 MED ORDER — POTASSIUM CHLORIDE CRYS ER 20 MEQ PO TBCR
20.0000 meq | EXTENDED_RELEASE_TABLET | Freq: Two times a day (BID) | ORAL | Status: AC
  Administered 2016-02-26 – 2016-02-27 (×3): 20 meq via ORAL
  Filled 2016-02-26 (×4): qty 1

## 2016-02-26 MED ORDER — NALTREXONE HCL 50 MG PO TABS
50.0000 mg | ORAL_TABLET | Freq: Every day | ORAL | Status: DC
  Administered 2016-02-26 – 2016-02-29 (×4): 50 mg via ORAL
  Filled 2016-02-26 (×2): qty 1
  Filled 2016-02-26: qty 14
  Filled 2016-02-26 (×3): qty 1
  Filled 2016-02-26: qty 14
  Filled 2016-02-26: qty 1

## 2016-02-26 NOTE — Progress Notes (Signed)
The patient attended the evening A.A. Meeting and was appropriate.  

## 2016-02-26 NOTE — BHH Group Notes (Signed)
[facility] LCSW Group Therapy  02/26/2016 1:22 PM  Type of Therapy:  Group Therapy  Participation Level:  Active  Participation Quality:  Attentive  Affect:  Appropriate  Cognitive:  Alert and Oriented  Insight:  Improving  Engagement in Therapy:  Improving  Modes of Intervention:  Discussion, Education, Exploration, Problem-solving, Socialization and Support  Summary of Progress/Problems: Today's Topic: Overcoming Obstacles. Patients identified one short term goal and potential obstacles in reaching this goal. Patients processed barriers involved in overcoming these obstacles. Patients identified steps necessary for overcoming these obstacles and explored motivation (internal and external) for facing these difficulties head on.   Jean Perry N Smart LCSW 02/26/2016, 1:22 PM
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
## 2016-02-26 NOTE — Progress Notes (Signed)
DAR NOTE: Patient presents with anxious affect and depressed mood. Pt has been observed interacting with peers. Denies pain, auditory and visual hallucinations.  Rates depression at 4, hopelessness at 2, and anxiety at 4. Pt id not fill in the self inventory forms.  Maintained on routine safety checks.  Medications given as prescribed.  Support and encouragement offered as needed. Patient observed socializing with peers in the dayroom. Offered no complaint.

## 2016-02-26 NOTE — Tx Team (Signed)
Interdisciplinary Treatment and Diagnostic Plan Update  02/26/2016 Time of Session: 9:30AM Honesti Patschke MRN: [id number]  Principal Diagnosis: MDD (major depressive disorder), recurrent severe, without psychosis ([place])  Secondary Diagnoses: Principal Problem:   MDD (major depressive disorder), recurrent severe, without psychosis ([place])   Current Medications:  Current Facility-Administered Medications  Medication Dose Route Frequency Provider Last Rate Last Dose  . acetaminophen (TYLENOL) tablet 650 mg  650 mg Oral Q6H PRN Ethelene Hal, NP      . alum & mag hydroxide-simeth (MAALOX/MYLANTA) 200-200-20 MG/5ML suspension 30 mL  30 mL Oral Q4H PRN Ethelene Hal, NP      . FLUoxetine (PROZAC) capsule 10 mg  10 mg Oral Daily Ethelene Hal, NP   10 mg at 02/26/16 0831  . hydrOXYzine (ATARAX/VISTARIL) tablet 25 mg  25 mg Oral Q6H PRN Ethelene Hal, NP      . hydrOXYzine (ATARAX/VISTARIL) tablet 25 mg  25 mg Oral Q6H PRN Ethelene Hal, NP   25 mg at 02/25/16 2331  . loperamide (IMODIUM) capsule 2-4 mg  2-4 mg Oral PRN Ethelene Hal, NP      . LORazepam (ATIVAN) tablet 1 mg  1 mg Oral Q6H PRN Ethelene Hal, NP      . LORazepam (ATIVAN) tablet 1 mg  1 mg Oral QID Ethelene Hal, NP   1 mg at 02/26/16 1210   Followed by  . [START ON 02/27/2016] LORazepam (ATIVAN) tablet 1 mg  1 mg Oral TID Ethelene Hal, NP       Followed by  . [START ON 02/28/2016] LORazepam (ATIVAN) tablet 1 mg  1 mg Oral BID Ethelene Hal, NP       Followed by  . [START ON 03/01/2016] LORazepam (ATIVAN) tablet 1 mg  1 mg Oral Daily Ethelene Hal, NP      . magnesium hydroxide (MILK OF MAGNESIA) suspension 30 mL  30 mL Oral Daily PRN Ethelene Hal, NP      . multivitamin with minerals tablet 1 tablet  1 tablet Oral Daily Ethelene Hal, NP   1 tablet at 02/26/16 0831  . nicotine (NICODERM CQ - dosed in mg/24 hours) patch 21 mg  21 mg Transdermal  Daily Jenne Campus, MD   21 mg at 02/26/16 0839  . ondansetron (ZOFRAN-ODT) disintegrating tablet 4 mg  4 mg Oral Q6H PRN Ethelene Hal, NP      . potassium chloride SA (K-DUR,KLOR-CON) CR tablet 20 mEq  20 mEq Oral BID Derrill Center, NP      . QUEtiapine (SEROQUEL) tablet 25 mg  25 mg Oral QHS Ethelene Hal, NP   25 mg at 02/25/16 2156  . thiamine (VITAMIN B-1) tablet 100 mg  100 mg Oral Daily Ethelene Hal, NP   100 mg at 02/26/16 0831  . traZODone (DESYREL) tablet 50 mg  50 mg Oral QHS PRN Ethelene Hal, NP   50 mg at 02/25/16 2331   PTA Medications: No prescriptions prior to admission.    Patient Stressors: Financial difficulties Substance abuse  Patient Strengths: Ability for insight Capable of independent living Motivation for treatment/growth  Treatment Modalities: Medication Management, Group therapy, Case management,  1 to 1 session with clinician, Psychoeducation, Recreational therapy.   Physician Treatment Plan for Primary Diagnosis: MDD (major depressive disorder), recurrent severe, without psychosis ([place]) Long Term Goal(s): Improvement in symptoms so as ready for discharge Improvement in symptoms so as ready  for discharge   Short Term Goals: Ability to verbalize feelings will improve Ability to disclose and discuss suicidal ideas Ability to identify and develop effective coping behaviors will improve Ability to verbalize feelings will improve Ability to disclose and discuss suicidal ideas Ability to maintain clinical measurements within normal limits will improve Compliance with prescribed medications will improve  Medication Management: Evaluate patient's response, side effects, and tolerance of medication regimen.  Therapeutic Interventions: 1 to 1 sessions, Unit Group sessions and Medication administration.  Evaluation of Outcomes: Progressing  Physician Treatment Plan for Secondary Diagnosis: Principal Problem:   MDD (major  depressive disorder), recurrent severe, without psychosis ([place])  Long Term Goal(s): Improvement in symptoms so as ready for discharge Improvement in symptoms so as ready for discharge   Short Term Goals: Ability to verbalize feelings will improve Ability to disclose and discuss suicidal ideas Ability to identify and develop effective coping behaviors will improve Ability to verbalize feelings will improve Ability to disclose and discuss suicidal ideas Ability to maintain clinical measurements within normal limits will improve Compliance with prescribed medications will improve     Medication Management: Evaluate patient's response, side effects, and tolerance of medication regimen.  Therapeutic Interventions: 1 to 1 sessions, Unit Group sessions and Medication administration.  Evaluation of Outcomes: Progressing   RN Treatment Plan for Primary Diagnosis: MDD (major depressive disorder), recurrent severe, without psychosis ([place]) Long Term Goal(s): Knowledge of disease and therapeutic regimen to maintain health will improve  Short Term Goals: Ability to remain free from injury will improve, Ability to verbalize feelings will improve and Ability to disclose and discuss suicidal ideas  Medication Management: RN will administer medications as ordered by provider, will assess and evaluate patient's response and provide education to patient for prescribed medication. RN will report any adverse and/or side effects to prescribing provider.  Therapeutic Interventions: 1 on 1 counseling sessions, Psychoeducation, Medication administration, Evaluate responses to treatment, Monitor vital signs and CBGs as ordered, Perform/monitor CIWA, COWS, AIMS and Fall Risk screenings as ordered, Perform wound care treatments as ordered.  Evaluation of Outcomes: Progressing   LCSW Treatment Plan for Primary Diagnosis: MDD (major depressive disorder), recurrent severe, without psychosis ([place]) Long Term Goal(s):  Safe transition to appropriate next level of care at discharge, Engage patient in therapeutic group addressing interpersonal concerns.  Short Term Goals: Engage patient in aftercare planning with referrals and resources, Facilitate patient progression through stages of change regarding substance use diagnoses and concerns and Identify triggers associated with mental health/substance abuse issues  Therapeutic Interventions: Assess for all discharge needs, 1 to 1 time with Social worker, Explore available resources and support systems, Assess for adequacy in community support network, Educate family and significant other(s) on suicide prevention, Complete Psychosocial Assessment, Interpersonal group therapy.  Evaluation of Outcomes: Progressing   Progress in Treatment: Attending groups: Yes. Participating in groups: Yes. Taking medication as prescribed: Yes. Toleration medication: Yes. Family/Significant other contact made: No, will contact:  family member if patient consents. Patient understands diagnosis: Yes. Discussing patient identified problems/goals with staff: Yes. Medical problems stabilized or resolved: Yes. Denies suicidal/homicidal ideation: Yes. Issues/concerns per patient self-inventory: No. Other: n/a  New problem(s) identified: No, Describe:  n/a  New Short Term/Long Term Goal(s): medication management; Detox; development of comprehensive mental wellness/sobriety plan.   Discharge Plan or Barriers:  CSW assessing for appropriate referrals. Possibly [facility].   Reason for Continuation of Hospitalization: Depression Medication stabilization Suicidal ideation Withdrawal symptoms  Estimated Length of Stay: 3-5 days  Attendees: Patient: 02/26/2016 3:41 PM  Physician: Dr. Sanjuana Letters MD 02/26/2016 3:41 PM  Nursing: Minta Balsam RN 02/26/2016 3:41 PM  RN Care Manager: Lars Pinks CM 02/26/2016 3:41 PM  Social Worker: Maxie Better, LCSW 02/26/2016 3:41 PM  Recreational  Therapist: Rhunette Croft 02/26/2016 3:41 PM  Other: Samuel Jester NP; Ricky Ala NP 02/26/2016 3:41 PM  Other:  02/26/2016 3:41 PM  Other: 02/26/2016 3:41 PM    Scribe for Treatment Team: Kimber Relic Smart, LCSW 02/26/2016 3:41 PM

## 2016-02-26 NOTE — BHH Suicide Risk Assessment (Addendum)
[facility] Admission Suicide Risk Assessment   Nursing information obtained from:  Patient Demographic factors:  Unemployed Current Mental Status:  Self-harm thoughts Loss Factors:  NA Historical Factors:  Prior suicide attempts (at 46 years old) Risk Reduction Factors:  Sense of responsibility to family  Total Time spent with patient: 30 minutes Principal Problem: MDD (major depressive disorder), recurrent severe, without psychosis ([place]) Diagnosis:   Patient Active Problem List   Diagnosis Date Noted  . MDD (major depressive disorder), recurrent severe, without psychosis ([place]) [F33.2] 02/25/2016  . Alcohol use disorder, severe, dependence ([place]) [F10.20] 02/24/2016  . Adjustment disorder with mixed anxiety and depressed mood [F43.23] 02/24/2016  . Depression [F32.9] 04/22/2014  . Alcohol abuse [F10.10] 04/22/2014  . Knee pain [M25.569] 04/22/2014  . Morbid obesity ([place]) [E66.01] 04/22/2014   Subjective Data:  46 yo female, single,  Homeless. Background  history of Alcohol use disorder. Voluntary admission for detox.  At interview, patient states that her last drink was a couple of days ago. No tremors, no retching or vomiting. No tactile, visual or auditory hallucinations. Patient reports feeling of emptiness but denies any active suicidal thoughts. She feels this is her last chance to get it together. Patient has insight into how alcohol has affected her life. Says she is optimistic to stay sober. Her main concern is disposition after detox. No thoughts of violence. No evidence of psychosis. No evidence of mania. No access to weapons. No cognitive disorder. We explored relapse prevention. We discussed use of Naltrexone as an anit-craving agent. Patient consented to treatment after we reviewed the risks and benefits.   Continued Clinical Symptoms:  Alcohol Use Disorder Identification Test Final Score (AUDIT): 30 The "Alcohol Use Disorders Identification Test", Guidelines for Use in Primary  Care, Second Edition.  World Pharmacologist [facility]). Score between 0-7:  no or low risk or alcohol related problems. Score between 8-15:  moderate risk of alcohol related problems. Score between 16-19:  high risk of alcohol related problems. Score 20 or above:  warrants further diagnostic evaluation for alcohol dependence and treatment.   CLINICAL FACTORS:  Alcohol use disorder Homelessness Unemployed depression    Musculoskeletal: Strength & Muscle Tone: within normal limits Gait & Station: normal Patient leans: N/A  Psychiatric Specialty Exam: Physical Exam  Constitutional: She is oriented to person, place, and time. She appears well-developed and well-nourished.  HENT:  Head: Normocephalic and atraumatic.  Eyes: Conjunctivae and EOM are normal. Pupils are equal, round, and reactive to light.  Neck: Normal range of motion. Neck supple.  Cardiovascular: Normal rate, regular rhythm and normal heart sounds.   Respiratory: Effort normal and breath sounds normal.  GI: Soft. Bowel sounds are normal.  Musculoskeletal: Normal range of motion.  Neurological: She is alert and oriented to person, place, and time. She has normal reflexes.  Psychiatric:  As above    ROS  Blood pressure (!) 159/90, pulse 86, temperature 98.8 F (37.1 C), temperature source Oral, resp. rate 20, height 5\' 5"  (1.651 m), weight 115.7 kg (255 lb), last menstrual period 02/13/2016, SpO2 99 %.Body mass index is 42.43 kg/m.  General Appearance: Not in any distress. Teared up appropriately while talking about her kids. No evidence of withdrawals or nueropsychiatric complications of alcohol  Eye Contact:  Fair  Speech:  Normal Rate  Volume:  Normal  Mood:  Depressed  Affect:  Blunt and Congruent  Thought Process:  Linear  Orientation:  Full (Time, Place, and Person)  Thought Content:  worries about the  future. No thoughts of violence.  No hallucination in any modality.  Suicidal Thoughts:  No  Homicidal  Thoughts:  No  Memory:  Immediate;   Fair Recent;   Fair Remote;   Fair  Judgement:  Fair  Insight:  Good  Psychomotor Activity:  Decreased  Concentration:  Attention Span: Fair  Recall:  [place] of Knowledge:  Good  Language:  Good  Akathisia:  No  Handed:    AIMS (if indicated):     Assets:  Communication Skills Desire for Improvement Resilience  ADL's:  Intact  Cognition:  WNL  Sleep:  Number of Hours: 4      COGNITIVE FEATURES THAT CONTRIBUTE TO RISK:  None    SUICIDE RISK:   Moderate:  Frequent suicidal ideation with limited intensity, and duration, some specificity in terms of plans, no associated intent, good self-control, limited dysphoria/symptomatology, some risk factors present, and identifiable protective factors, including available and accessible social support.  PLAN OF CARE:   1. Increase Prozac to 20 mg daily 2. Discontinue Seroquel. Would use PRN Trazodone to target insomnia 3. Alcohol withdrawal protocol 4. SW would coordinate aftercare 5. Naltrexone 50 mg daily  I certify that inpatient services furnished can reasonably be expected to improve the patient's condition.   Artist Beach, MD 02/26/2016, 3:18 PM

## 2016-02-26 NOTE — BHH Group Notes (Addendum)
spiritual care group on grief and loss facilitated by chaplain Jerene Pitch   Group opened with brief discussion and psycho-social ed around grief and loss in relationships and in relation to self - identifying life patterns, circumstances, changes that cause losses. Established group norm of speaking from own life experience. Group goal of establishing open and affirming space for members to share loss and experience with grief, normalize grief experience and provide psycho social education and grief support.   [place] was invited to attend group.  Did not attend.       [place], [place]
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
## 2016-02-26 NOTE — Progress Notes (Signed)
Recreation Therapy Notes  Date: 02/26/16 Time: 0930 Location: [address] Day[address]  Group Topic: Stress Management  Goal Area(s) Addresses:  Patient will verbalize importance of using healthy stress management.  Patient will identify positive emotions associated with healthy stress management.   Intervention: Stress Management  Activity :  Meditation.  LRT introduced the stress management technique of meditation.  LRT played a meditation from the Calm app to give patients the opportunity to engage in the activity.  Patients were to follow along as LRT played the meditation.  Education:  Stress Management, Discharge Planning.   Education Outcome: Acknowledges edcuation/In group clarification offered/Needs additional education  Clinical Observations/Feedback: Pt did not attend group.    Victorino Sparrow, LRT/CTRS         Victorino Sparrow A 02/26/2016 3:26 PM

## 2016-02-27 DIAGNOSIS — Z79899 Other long term (current) drug therapy: Secondary | ICD-10-CM

## 2016-02-27 DIAGNOSIS — F4323 Adjustment disorder with mixed anxiety and depressed mood: Secondary | ICD-10-CM

## 2016-02-27 DIAGNOSIS — Z801 Family history of malignant neoplasm of trachea, bronchus and lung: Secondary | ICD-10-CM

## 2016-02-27 DIAGNOSIS — Z811 Family history of alcohol abuse and dependence: Secondary | ICD-10-CM

## 2016-02-27 DIAGNOSIS — F332 Major depressive disorder, recurrent severe without psychotic features: Principal | ICD-10-CM

## 2016-02-27 DIAGNOSIS — F17210 Nicotine dependence, cigarettes, uncomplicated: Secondary | ICD-10-CM

## 2016-02-27 DIAGNOSIS — F1020 Alcohol dependence, uncomplicated: Secondary | ICD-10-CM

## 2016-02-27 NOTE — Plan of Care (Signed)
Problem: Education: Goal: Understanding of discharge needs will improve Outcome: Progressing Pt plans to work on accepting things she can not change

## 2016-02-27 NOTE — Progress Notes (Signed)
D: Pt denies SI/HI/AVH. Pt is pleasant and cooperative. Pt stated she "still has a confused feeling", but pt stated she feels better overall. Pt plans to move forward and accept reality and continue to progress.   A: Pt was offered support and encouragement. Pt was given scheduled medications. Pt was encourage to attend groups. Q 15 minute checks were done for safety.   R:Pt attends groups and interacts well with peers and staff. Pt is taking medication. Pt has no complaints.Pt receptive to treatment and safety maintained on unit.

## 2016-02-27 NOTE — BHH Group Notes (Signed)
[place] LCSW Group Therapy  02/27/2016 4:34 PM  Type of Therapy:  Group Therapy  Participation Level:  Active  Participation Quality:  Attentive  Affect:  Appropriate  Cognitive:  Appropriate  Insight:  Engaged  Engagement in Therapy:  Engaged  Modes of Intervention:  Confrontation, Discussion, Education, Problem-solving, Socialization and Support  Summary of Progress/Problems: [facility]HA Speaker came to talk about his personal journey with substance abuse and addiction. The pt processed ways by which to relate to the speaker. [place] speaker provided handouts and educational information pertaining to groups and services offered by the [facility].   [place] LCSW 02/27/2016, 4:34 PM

## 2016-02-27 NOTE — Progress Notes (Signed)
Pt attend group. Her day was 5. Her goal to take a look at the past present and future. She want a cigarette but she not going to think about it or take the patch for cigarette she's trying to quit.

## 2016-02-27 NOTE — Progress Notes (Signed)
Recreation Therapy Notes  Animal-Assisted Activity (AAA) Program Checklist/Progress Notes Patient Eligibility Criteria Checklist & Daily Group note for Rec TxIntervention  Date: 02.06.2018 Time: 2:45pm Location: [address]    AAA/T Program Assumption of Risk Form signed by Patient/ or Parent Legal Guardian Yes  Patient is free of allergies or sever asthma Yes  Patient reports no fear of animals Yes  Patient reports no history of cruelty to animals Yes  Patient understands his/her participation is voluntary Yes  Patient washes hands before animal contact Yes  Patient washes hands after animal contact Yes  Behavioral Response: Did not attend.   Laureen Ochs Keaira Whitehurst, LRT/CTRS        Maccoy Haubner L 02/27/2016 3:05 PM

## 2016-02-27 NOTE — Progress Notes (Signed)
D: Pt passive SI-contracts for safety denies HI/AVH. Pt is pleasant and cooperative. Pt Stated she was feeling better because she was having clearer thoughts while coming off the ETOH. Pt wants to go to someplace like [facility].    A: Pt was offered support and encouragement. Pt was given scheduled medications. Pt was encourage to attend groups. Q 15 minute checks were done for safety.   R:Pt attends groups and interacts well with peers and staff. Pt is taking medication. Pt has no complaints.Pt receptive to treatment and safety maintained on unit.

## 2016-02-27 NOTE — Progress Notes (Signed)
[facility] MD Progress Note  02/27/2016 1:05 PM Jean Perry  MRN:  [id number]  Subjective: Jean Perry reports, "I'm still feeling confused. I still feel like I don't want to be in world any more". However, she also adds that she is trying to work through these feelings. Is hopel about going to the [facility] treatment center. She is wondering if they will be able to help at [facility] with her substance abuse treatment as well as housing assistance.  Objective: patient is seen, chart reviewed. She is alert, oriented x 3. She is aware of situation. She presents today with some improved confusion, however, continue to endorse passive suicidal ideations. She is able to contract for safety. She continues endorse symptoms of depression & presents as tearful because she says her children has nothing to do with her. She is hoping to get into the [facility] treatment center for further substance abuse treatment after discharge. She continue to express her worries about being homeless.  Principal Problem: MDD (major depressive disorder), recurrent severe, without psychosis ([place])  Diagnosis:   Patient Active Problem List   Diagnosis Date Noted  . MDD (major depressive disorder), recurrent severe, without psychosis ([place]) [F33.2] 02/25/2016  . Alcohol use disorder, severe, dependence ([place]) [F10.20] 02/24/2016  . Adjustment disorder with mixed anxiety and depressed mood [F43.23] 02/24/2016  . Depression [F32.9] 04/22/2014  . Alcohol abuse [F10.10] 04/22/2014  . Knee pain [M25.569] 04/22/2014  . Morbid obesity ([place]) [E66.01] 04/22/2014   Total Time spent with patient: 25 minutes  Past Psychiatric History: Alcoholism, MDD.  Past Medical History:  Past Medical History:  Diagnosis Date  . Alcohol abuse   . Alcohol use   . Depression   . Elevated blood pressure   . Fibroids   . History of chicken pox   . Knee pain   . Urine incontinence    History reviewed. No pertinent surgical  history. Family History:  Family History  Problem Relation Age of Onset  . Alcoholism Mother   . Alcoholism Father   . Lung cancer Maternal Grandfather    Family Psychiatric  History: See H&P  Social History:  History  Alcohol Use  . Yes    Comment: pt states she drinks 1/2 a fifth of liquor a day     History  Drug Use No    Social History   Social History  . Marital status: Single    Spouse name: N/A  . Number of children: N/A  . Years of education: N/A   Social History Main Topics  . Smoking status: Current Every Day Smoker    Packs/day: 1.00    Types: Cigarettes  . Smokeless tobacco: Never Used  . Alcohol use Yes     Comment: pt states she drinks 1/2 a fifth of liquor a day  . Drug use: No  . Sexual activity: Not Asked   Other Topics Concern  . None   Social History Narrative   Work or School: full Copy Situation: lives alone      Spiritual Beliefs: Christian      Lifestyle: no exercise, not eating well         Additional Social History:   Sleep: Fair  Appetite:  Fair  Current Medications: Current Facility-Administered Medications  Medication Dose Route Frequency Provider Last Rate Last Dose  . acetaminophen (TYLENOL) tablet 650 mg  650 mg Oral Q6H PRN Ethelene Hal, NP      .  alum & mag hydroxide-simeth (MAALOX/MYLANTA) 200-200-20 MG/5ML suspension 30 mL  30 mL Oral Q4H PRN Ethelene Hal, NP      . FLUoxetine (PROZAC) capsule 20 mg  20 mg Oral Daily Artist Beach, MD   20 mg at 02/27/16 0824  . hydrOXYzine (ATARAX/VISTARIL) tablet 25 mg  25 mg Oral Q6H PRN Ethelene Hal, NP   25 mg at 02/25/16 2331  . loperamide (IMODIUM) capsule 2-4 mg  2-4 mg Oral PRN Ethelene Hal, NP      . LORazepam (ATIVAN) tablet 1 mg  1 mg Oral Q6H PRN Ethelene Hal, NP      . LORazepam (ATIVAN) tablet 1 mg  1 mg Oral TID Ethelene Hal, NP   1 mg at 02/27/16 1216   Followed by  . [START ON  02/28/2016] LORazepam (ATIVAN) tablet 1 mg  1 mg Oral BID Ethelene Hal, NP       Followed by  . [START ON 03/01/2016] LORazepam (ATIVAN) tablet 1 mg  1 mg Oral Daily Ethelene Hal, NP      . magnesium hydroxide (MILK OF MAGNESIA) suspension 30 mL  30 mL Oral Daily PRN Ethelene Hal, NP      . multivitamin with minerals tablet 1 tablet  1 tablet Oral Daily Ethelene Hal, NP   1 tablet at 02/27/16 [id number]  . naltrexone (DEPADE) tablet 50 mg  50 mg Oral Daily Artist Beach, MD   50 mg at 02/27/16 0825  . nicotine (NICODERM CQ - dosed in mg/24 hours) patch 21 mg  21 mg Transdermal Daily Jenne Campus, MD   21 mg at 02/26/16 0839  . ondansetron (ZOFRAN-ODT) disintegrating tablet 4 mg  4 mg Oral Q6H PRN Ethelene Hal, NP      . potassium chloride SA (K-DUR,KLOR-CON) CR tablet 20 mEq  20 mEq Oral BID Derrill Center, NP   20 mEq at 02/27/16 0824  . thiamine (VITAMIN B-1) tablet 100 mg  100 mg Oral Daily Ethelene Hal, NP   100 mg at 02/27/16 [id number]  . traZODone (DESYREL) tablet 50 mg  50 mg Oral QHS PRN Ethelene Hal, NP   50 mg at 02/25/16 2331   Lab Results: No results found for this or any previous visit (from the past 48 hour(s)).  Blood Alcohol level:  Lab Results  Component Value Date   ETH <5 02/23/2016   ETH 142 (H) [id number]   Metabolic Disorder Labs: No results found for: HGBA1C, MPG No results found for: PROLACTIN No results found for: CHOL, TRIG, HDL, CHOLHDL, VLDL, LDLCALC  Physical Findings: AIMS:  , ,  ,  ,    CIWA:  CIWA-Ar Total: 1 COWS:     Musculoskeletal: Strength & Muscle Tone: within normal limits Gait & Station: normal Patient leans: N/A  Psychiatric Specialty Exam: Physical Exam  Review of Systems  Constitutional: Negative.   HENT: Negative.   Eyes: Negative.   Respiratory: Negative.   Cardiovascular: Negative.   Gastrointestinal: Negative.   Genitourinary: Negative.   Musculoskeletal: Negative.   Skin:  Negative.   Neurological: Negative.   Endo/Heme/Allergies: Negative.   Psychiatric/Behavioral: Positive for depression, substance abuse (Alcoholism, ) and suicidal ideas (Passive SI, denies any plans or intent.).    Blood pressure (!) 156/87, pulse 96, temperature 97.7 F (36.5 C), temperature source Oral, resp. rate 16, height 5\' 5"  (1.651 m), weight 115.7 kg (255 lb), last menstrual period 02/13/2016, SpO2  99 %.Body mass index is 42.43 kg/m.  General Appearance: Not in any distress. Teared up appropriately while talking about her kids. No evidence of withdrawals or nueropsychiatric complications of alcohol  Eye Contact:  Fair  Speech:  Normal Rate  Volume:  Normal  Mood:  Depressed  Affect:  Blunt and Congruent  Thought Process:  Linear  Orientation:  Full (Time, Place, and Person)  Thought Content:  worries about the future. No thoughts of violence.  No hallucination in any modality.  Suicidal Thoughts:  No  Homicidal Thoughts:  No  Memory:  Immediate;   Fair Recent;   Fair Remote;   Fair  Judgement:  Fair  Insight:  Good  Psychomotor Activity:  Decreased  Concentration:  Attention Span: Fair  Recall:  [place] of Knowledge:  Good  Language:  Good  Akathisia:  No  Handed:    AIMS (if indicated):     Assets:  Communication Skills Desire for Improvement Resilience  ADL's:  Intact  Cognition:  WNL     Treatment Plan Summary:  Daily contact with patient to assess and evaluate symptoms and progress in treatment and Medication management  Reviewed past medical records & treatment plan.   For Depressed mood/anxiety: Will continue Fluoxetine 20 mg po daily.  For Anxiety disorder: Will continue hydroxyzine 25 mg po Q 6 hours prn.  Alcoholism/alcohol cravings: Will continue Naltrexone 50 mg daily.  Alcohol detox: Will continue Ativan detox protocols.  For Nicotine cravings: Will continue Nicoderm CQ 21 mg patch Q 24 hours.  For insomnia: Will continue Trazodone  50 mg po qhs prn.  Other medical issues & concerns: Will Continue to monitor for any presenting symptoms.  - Continue 15 minutes observation for safety concerns - Encouraged to participate in milieu therapy and group therapy counseling sessions and also work with coping skills -  Develop treatment plan to reduce the need for readmission. -  Psycho-social education regarding self care. - Health care follow up as needed for medical problems. - Restart home medications where appropriate.  Encarnacion Slates, NP, PMHNP, FNP-BC 02/27/2016, 1:05 PM

## 2016-02-27 NOTE — Plan of Care (Signed)
Problem: Safety: Goal: Periods of time without injury will increase Outcome: Progressing Pt safe on the unit at this time   

## 2016-02-27 NOTE — Progress Notes (Signed)
DAR NOTE: Pt present with flat affect and depressed mood in the unit. Pt has been visible in the milieu interacting with peers. Pt denies physical pain, took all her meds as scheduled. As per self inventory, pt had a poor night sleep, good appetite, low energy, and poor concentration. Pt rate depression at 8, hopeless ness at 7, and anxiety at 8. Pt's safety ensured with 15 minute and environmental checks. Pt currently denies SI/HI and A/V hallucinations. Pt verbally agrees to seek staff if SI/HI or A/VH occurs and to consult with staff before acting on these thoughts. Will continue POC.

## 2016-02-28 LAB — COMPREHENSIVE METABOLIC PANEL
ALT: 238 U/L — AB (ref 14–54)
AST: 252 U/L — AB (ref 15–41)
Albumin: 3.8 g/dL (ref 3.5–5.0)
Alkaline Phosphatase: 70 U/L (ref 38–126)
Anion gap: 8 (ref 5–15)
BUN: 9 mg/dL (ref 6–20)
CHLORIDE: 106 mmol/L (ref 101–111)
CO2: 25 mmol/L (ref 22–32)
CREATININE: 0.63 mg/dL (ref 0.44–1.00)
Calcium: 9 mg/dL (ref 8.9–10.3)
GFR calc Af Amer: >60 mL/min (ref >60)
GFR calc non Af Amer: >60 mL/min (ref >60)
GLUCOSE: 104 mg/dL — AB (ref 65–99)
Potassium: 4 mmol/L (ref 3.5–5.1)
SODIUM: 139 mmol/L (ref 135–145)
Total Bilirubin: 0.7 mg/dL (ref 0.3–1.2)
Total Protein: 7.3 g/dL (ref 6.5–8.1)

## 2016-02-28 MED ORDER — TRAZODONE HCL 50 MG PO TABS: 50.0000 mg | ORAL_TABLET | Freq: Every evening | ORAL | 0 refills | Status: AC | PRN

## 2016-02-28 MED ORDER — HYDROXYZINE HCL 25 MG PO TABS: 25.0000 mg | ORAL_TABLET | Freq: Four times a day (QID) | ORAL | 0 refills | Status: AC | PRN

## 2016-02-28 MED ORDER — FLUOXETINE HCL 20 MG PO CAPS: 20.0000 mg | ORAL_CAPSULE | Freq: Every day | ORAL | 0 refills | Status: AC

## 2016-02-28 MED ORDER — NALTREXONE HCL 50 MG PO TABS: 50.0000 mg | ORAL_TABLET | Freq: Every day | ORAL | 0 refills | Status: AC

## 2016-02-28 MED ORDER — NICOTINE 21 MG/24HR TD PT24: 21.0000 mg | MEDICATED_PATCH | Freq: Every day | TRANSDERMAL | 0 refills | Status: AC

## 2016-02-28 NOTE — Tx Team (Signed)
Interdisciplinary Treatment and Diagnostic Plan Update  02/28/2016 Time of Session: 9:30AM Jean Perry MRN: [id number]  Principal Diagnosis: MDD (major depressive disorder), recurrent severe, without psychosis ([place])  Secondary Diagnoses: Principal Problem:   MDD (major depressive disorder), recurrent severe, without psychosis ([place])   Current Medications:  Current Facility-Administered Medications  Medication Dose Route Frequency Provider Last Rate Last Dose  . acetaminophen (TYLENOL) tablet 650 mg  650 mg Oral Q6H PRN Ethelene Hal, NP      . alum & mag hydroxide-simeth (MAALOX/MYLANTA) 200-200-20 MG/5ML suspension 30 mL  30 mL Oral Q4H PRN Ethelene Hal, NP      . FLUoxetine (PROZAC) capsule 20 mg  20 mg Oral Daily Artist Beach, MD   20 mg at 02/28/16 0815  . hydrOXYzine (ATARAX/VISTARIL) tablet 25 mg  25 mg Oral Q6H PRN Ethelene Hal, NP   25 mg at 02/27/16 2126  . loperamide (IMODIUM) capsule 2-4 mg  2-4 mg Oral PRN Ethelene Hal, NP      . LORazepam (ATIVAN) tablet 1 mg  1 mg Oral Q6H PRN Ethelene Hal, NP      . LORazepam (ATIVAN) tablet 1 mg  1 mg Oral BID Ethelene Hal, NP       Followed by  . [START ON 03/01/2016] LORazepam (ATIVAN) tablet 1 mg  1 mg Oral Daily Ethelene Hal, NP      . magnesium hydroxide (MILK OF MAGNESIA) suspension 30 mL  30 mL Oral Daily PRN Ethelene Hal, NP      . multivitamin with minerals tablet 1 tablet  1 tablet Oral Daily Ethelene Hal, NP   1 tablet at 02/28/16 0815  . naltrexone (DEPADE) tablet 50 mg  50 mg Oral Daily Artist Beach, MD   50 mg at 02/28/16 0816  . nicotine (NICODERM CQ - dosed in mg/24 hours) patch 21 mg  21 mg Transdermal Daily Jenne Campus, MD   21 mg at 02/26/16 0839  . ondansetron (ZOFRAN-ODT) disintegrating tablet 4 mg  4 mg Oral Q6H PRN Ethelene Hal, NP      . thiamine (VITAMIN B-1) tablet 100 mg  100 mg Oral Daily Ethelene Hal, NP    100 mg at 02/28/16 0815  . traZODone (DESYREL) tablet 50 mg  50 mg Oral QHS PRN Ethelene Hal, NP   50 mg at 02/27/16 2126   PTA Medications: No prescriptions prior to admission.    Patient Stressors: Financial difficulties Substance abuse  Patient Strengths: Ability for insight Capable of independent living Motivation for treatment/growth  Treatment Modalities: Medication Management, Group therapy, Case management,  1 to 1 session with clinician, Psychoeducation, Recreational therapy.   Physician Treatment Plan for Primary Diagnosis: MDD (major depressive disorder), recurrent severe, without psychosis ([place]) Long Term Goal(s): Improvement in symptoms so as ready for discharge Improvement in symptoms so as ready for discharge   Short Term Goals: Ability to verbalize feelings will improve Ability to disclose and discuss suicidal ideas Ability to identify and develop effective coping behaviors will improve Ability to verbalize feelings will improve Ability to disclose and discuss suicidal ideas Ability to maintain clinical measurements within normal limits will improve Compliance with prescribed medications will improve  Medication Management: Evaluate patient's response, side effects, and tolerance of medication regimen.  Therapeutic Interventions: 1 to 1 sessions, Unit Group sessions and Medication administration.  Evaluation of Outcomes: Met  Physician Treatment Plan for Secondary Diagnosis: Principal Problem:  MDD (major depressive disorder), recurrent severe, without psychosis ([place])  Long Term Goal(s): Improvement in symptoms so as ready for discharge Improvement in symptoms so as ready for discharge   Short Term Goals: Ability to verbalize feelings will improve Ability to disclose and discuss suicidal ideas Ability to identify and develop effective coping behaviors will improve Ability to verbalize feelings will improve Ability to disclose and discuss suicidal  ideas Ability to maintain clinical measurements within normal limits will improve Compliance with prescribed medications will improve     Medication Management: Evaluate patient's response, side effects, and tolerance of medication regimen.  Therapeutic Interventions: 1 to 1 sessions, Unit Group sessions and Medication administration.  Evaluation of Outcomes: Met   RN Treatment Plan for Primary Diagnosis: MDD (major depressive disorder), recurrent severe, without psychosis ([place]) Long Term Goal(s): Knowledge of disease and therapeutic regimen to maintain health will improve  Short Term Goals: Ability to remain free from injury will improve, Ability to verbalize feelings will improve and Ability to disclose and discuss suicidal ideas  Medication Management: RN will administer medications as ordered by provider, will assess and evaluate patient's response and provide education to patient for prescribed medication. RN will report any adverse and/or side effects to prescribing provider.  Therapeutic Interventions: 1 on 1 counseling sessions, Psychoeducation, Medication administration, Evaluate responses to treatment, Monitor vital signs and CBGs as ordered, Perform/monitor CIWA, COWS, AIMS and Fall Risk screenings as ordered, Perform wound care treatments as ordered.  Evaluation of Outcomes: Met   LCSW Treatment Plan for Primary Diagnosis: MDD (major depressive disorder), recurrent severe, without psychosis ([place]) Long Term Goal(s): Safe transition to appropriate next level of care at discharge, Engage patient in therapeutic group addressing interpersonal concerns.  Short Term Goals: Engage patient in aftercare planning with referrals and resources, Facilitate patient progression through stages of change regarding substance use diagnoses and concerns and Identify triggers associated with mental health/substance abuse issues  Therapeutic Interventions: Assess for all discharge needs, 1 to 1 time  with Social worker, Explore available resources and support systems, Assess for adequacy in community support network, Educate family and significant other(s) on suicide prevention, Complete Psychosocial Assessment, Interpersonal group therapy.  Evaluation of Outcomes: Met   Progress in Treatment: Attending groups: Yes. Participating in groups: Yes. Taking medication as prescribed: Yes. Toleration medication: Yes. Family/Significant other contact made: SPE completed with pt; pt declined to consent to family contact.  Patient understands diagnosis: Yes. Discussing patient identified problems/goals with staff: Yes. Medical problems stabilized or resolved: Yes. Denies suicidal/homicidal ideation: Yes. Issues/concerns per patient self-inventory: No. Other: n/a  New problem(s) identified: No, Describe:  n/a  New Short Term/Long Term Goal(s): medication management; Detox; development of comprehensive mental wellness/sobriety plan.   Discharge Plan or Barriers:  Pt has screening for admission on Thursday at 1145am at [facility]. Taxi voucher in chart.   Reason for Continuation of Hospitalization: none  Estimated Length of Stay: d/c today  Attendees: Patient: 02/28/2016 12:58 PM  Physician: Dr. Daron Offer MD 02/28/2016 12:58 PM  Nursing: Carlynn Purl RN 02/28/2016 12:58 PM  RN Care Manager: Lars Pinks CM 02/28/2016 12:58 PM  Social Worker: Maxie Better, LCSW 02/28/2016 12:58 PM  Recreational Therapist: Rhunette Croft 02/28/2016 12:58 PM  Other: Samuel Jester NP; Lindell Spar NP 02/28/2016 12:58 PM  Other:  02/28/2016 12:58 PM  Other: 02/28/2016 12:58 PM    Scribe for Treatment Team: Kimber Relic Smart, LCSW 02/28/2016 12:58 PM

## 2016-02-28 NOTE — B[facility] Suicide Risk Assessment (Signed)
[facility] INPATIENT:  Family/Significant Other Suicide Prevention Education  Suicide Prevention Education:  Patient Refusal for Family/Significant Other Suicide Prevention Education: The patient Jean Perry has refused to provide written consent for family/significant other to be provided Family/Significant Other Suicide Prevention Education during admission and/or prior to discharge.  Physician notified.  SPE completed with pt, as pt refused to consent to family contact. SPI pamphlet provided to pt and pt was encouraged to share information with support network, ask questions, and talk about any concerns relating to SPE. Pt denies access to guns/firearms and verbalized understanding of information provided. Mobile Crisis information also provided to pt.   Leondra Cullin N Smart LCSW 02/28/2016, 12:56 PM

## 2016-02-28 NOTE — B[facility] Group Notes (Signed)
[facility] LCSW Group Therapy  02/28/2016 3:44 PM  Type of Therapy:  Group Therapy  Participation Level:  Active  Participation Quality:  Attentive  Affect:  Appropriate  Cognitive:  Alert and Oriented  Insight:  Engaged  Engagement in Therapy:  Engaged  Modes of Intervention:  Confrontation, Discussion, Education, Problem-solving, Socialization and Support  Summary of Progress/Problems: Emotion Regulation: This group focused on both positive and negative emotion identification and allowed group members to process ways to identify feelings, regulate negative emotions, and find healthy ways to manage internal/external emotions. Group members were asked to reflect on a time when their reaction to an emotion led to a negative outcome and explored how alternative responses using emotion regulation would have benefited them. Group members were also asked to discuss a time when emotion regulation was utilized when a negative emotion was experienced.   Jean Relic Smart LCSW 02/28/2016, 3:44 PM

## 2016-02-28 NOTE — Progress Notes (Signed)
[facility] MD Progress Note  02/28/2016 11:46 AM Jean Perry  MRN:  [id number] Subjective:  Spent time with Jean Perry discussing her admission, struggle with alcohol use. She did a nice job reflecting on her values and sense that she was not participating in her life.  She is motivated to stop using alcohol because she wants to be "awake" and "experience" life rather than to spend her time intoxicated as time passes by.  She has trouble in reflecting on what the future may hold for her, and remains focussed on taking things "one step at a time."  She is happy to receive the help offered and to go to [facility] as planned for tomorrow's discharge.  She denies any thoughts to harm herself at present, but remains with passive thoughts of SI reflecting on the hopelessness of her socioeconomic status and her broken relationships with her family.  She was tearful in sharing, but also maintained an appropriate range of affect, able to smile at times.  She is tolerating her medications without any significant side effects.  She admits she is not a fan of medications but has hope that Fluoxetine will help her along.  She has had fair energy and she sleeps okay with trazodone.  No active withdrawal symptoms, confusion, tremors, nausea, vomiting.    Principal Problem: MDD (major depressive disorder), recurrent severe, without psychosis ([place]) Diagnosis:   Patient Active Problem List   Diagnosis Date Noted  . MDD (major depressive disorder), recurrent severe, without psychosis ([place]) [F33.2] 02/25/2016  . Alcohol use disorder, severe, dependence ([place]) [F10.20] 02/24/2016  . Adjustment disorder with mixed anxiety and depressed mood [F43.23] 02/24/2016  . Depression [F32.9] 04/22/2014  . Alcohol abuse [F10.10] 04/22/2014  . Knee pain [M25.569] 04/22/2014  . Morbid obesity ([place]) [E66.01] 04/22/2014   Total Time spent with patient: 20 minutes  Past Psychiatric History:  Spent time reviewing mood and substance  history. See admission HP for full details  Past Medical History:  Past Medical History:  Diagnosis Date  . Alcohol abuse   . Alcohol use   . Depression   . Elevated blood pressure   . Fibroids   . History of chicken pox   . Knee pain   . Urine incontinence    History reviewed. No pertinent surgical history. Family History:  Family History  Problem Relation Age of Onset  . Alcoholism Mother   . Alcoholism Father   . Lung cancer Maternal Grandfather    Family Psychiatric  History: See H&P Social History:  History  Alcohol Use  . Yes    Comment: pt states she drinks 1/2 a fifth of liquor a day     History  Drug Use No    Social History   Social History  . Marital status: Single    Spouse name: N/A  . Number of children: N/A  . Years of education: N/A   Social History Main Topics  . Smoking status: Current Every Day Smoker    Packs/day: 1.00    Types: Cigarettes  . Smokeless tobacco: Never Used  . Alcohol use Yes     Comment: pt states she drinks 1/2 a fifth of liquor a day  . Drug use: No  . Sexual activity: Not Asked   Other Topics Concern  . None   Social History Narrative   Work or School: full Copy Situation: lives alone      Spiritual Beliefs: Darrick Meigs  Lifestyle: no exercise, not eating well         Additional Social History:    Sleep: Fair  Appetite:  Fair  Current Medications: Current Facility-Administered Medications  Medication Dose Route Frequency Provider Last Rate Last Dose  . acetaminophen (TYLENOL) tablet 650 mg  650 mg Oral Q6H PRN Ethelene Hal, NP      . alum & mag hydroxide-simeth (MAALOX/MYLANTA) 200-200-20 MG/5ML suspension 30 mL  30 mL Oral Q4H PRN Ethelene Hal, NP      . FLUoxetine (PROZAC) capsule 20 mg  20 mg Oral Daily Artist Beach, MD   20 mg at 02/28/16 0815  . hydrOXYzine (ATARAX/VISTARIL) tablet 25 mg  25 mg Oral Q6H PRN Ethelene Hal, NP   25 mg at  02/27/16 2126  . loperamide (IMODIUM) capsule 2-4 mg  2-4 mg Oral PRN Ethelene Hal, NP      . LORazepam (ATIVAN) tablet 1 mg  1 mg Oral Q6H PRN Ethelene Hal, NP      . LORazepam (ATIVAN) tablet 1 mg  1 mg Oral BID Ethelene Hal, NP       Followed by  . [START ON 03/01/2016] LORazepam (ATIVAN) tablet 1 mg  1 mg Oral Daily Ethelene Hal, NP      . magnesium hydroxide (MILK OF MAGNESIA) suspension 30 mL  30 mL Oral Daily PRN Ethelene Hal, NP      . multivitamin with minerals tablet 1 tablet  1 tablet Oral Daily Ethelene Hal, NP   1 tablet at 02/28/16 0815  . naltrexone (DEPADE) tablet 50 mg  50 mg Oral Daily Artist Beach, MD   50 mg at 02/28/16 0816  . nicotine (NICODERM CQ - dosed in mg/24 hours) patch 21 mg  21 mg Transdermal Daily Jenne Campus, MD   21 mg at 02/26/16 0839  . ondansetron (ZOFRAN-ODT) disintegrating tablet 4 mg  4 mg Oral Q6H PRN Ethelene Hal, NP      . thiamine (VITAMIN B-1) tablet 100 mg  100 mg Oral Daily Ethelene Hal, NP   100 mg at 02/28/16 0815  . traZODone (DESYREL) tablet 50 mg  50 mg Oral QHS PRN Ethelene Hal, NP   50 mg at 02/27/16 2126    Lab Results:  Results for orders placed or performed during the hospital encounter of 02/25/16 (from the past 48 hour(s))  Comprehensive metabolic panel     Status: Abnormal   Collection Time: 02/28/16  6:13 AM  Result Value Ref Range   Sodium 139 135 - 145 mmol/L   Potassium 4.0 3.5 - 5.1 mmol/L   Chloride 106 101 - 111 mmol/L   CO2 25 22 - 32 mmol/L   Glucose, Bld 104 (H) 65 - 99 mg/dL   BUN 9 6 - 20 mg/dL   Creatinine, Ser 0.63 0.44 - 1.00 mg/dL   Calcium 9.0 8.9 - 10.3 mg/dL   Total Protein 7.3 6.5 - 8.1 g/dL   Albumin 3.8 3.5 - 5.0 g/dL   AST 252 (H) 15 - 41 U/L   ALT 238 (H) 14 - 54 U/L   Alkaline Phosphatase 70 38 - 126 U/L   Total Bilirubin 0.7 0.3 - 1.2 mg/dL   GFR calc non Af Amer >60 >60 mL/min   GFR calc Af Amer >60 >60 mL/min     Comment: (NOTE) The eGFR has been calculated using the CKD EPI equation. This calculation has not  been validated in all clinical situations. eGFR's persistently <60 mL/min signify possible Chronic Kidney Disease.    Anion gap 8 5 - 15    Comment: Performed at [facility], [place] [address], [place], [place] [address]    Blood Alcohol level:  Lab Results  Component Value Date   ETH <5 02/23/2016   ETH 142 (H) 26/41/5830    Metabolic Disorder Labs: No results found for: HGBA1C, MPG No results found for: PROLACTIN No results found for: CHOL, TRIG, HDL, CHOLHDL, VLDL, LDLCALC  Physical Findings: AIMS: Facial and Oral Movements Muscles of Facial Expression: None, normal Lips and Perioral Area: None, normal Jaw: None, normal Tongue: None, normal Dental Status Current problems with teeth and/or dentures?: No Does patient usually wear dentures?: No  CIWA:  CIWA-Ar Total: 0 COWS:     Musculoskeletal: Strength & Muscle Tone: within normal limits Gait & Station: normal Patient leans: N/A  Psychiatric Specialty Exam: Physical Exam  ROS  Blood pressure 139/86, pulse 96, temperature 98.2 F (36.8 C), temperature source Oral, resp. rate 18, height 5' 5"  (1.651 m), weight 115.7 kg (255 lb), last menstrual period 02/13/2016, SpO2 99 %.Body mass index is 42.43 kg/m.  General Appearance: Casual and Fairly Groomed  Eye Contact:  Fair  Speech:  Normal Rate  Volume:  Normal  Mood:  Anxious and Depressed  Affect:  Congruent  Thought Process:  Coherent and Linear  Orientation:  Full (Time, Place, and Person)  Thought Content:  Logical  Suicidal Thoughts:  Yes.  without intent/plan  Homicidal Thoughts:  No  Memory:  Immediate;   Good Recent;   Good  Judgement:  Fair  Insight:  Fair  Psychomotor Activity:  Normal  Concentration:  Concentration: Fair and Attention Span: Good  Recall:  Good  Fund of Knowledge:  Good  Language:  Good  Akathisia:  Negative  Handed:   Right  AIMS (if indicated):     Assets:  Communication Skills Desire for Improvement Physical Health  ADL's:  Intact  Cognition:  WNL  Sleep:  Number of Hours: 6.75   Treatment Plan Summary: # MDD: Continue Fluoxetine 20 mg daily Continue Trazodone 50 mg QHS prn Continue vistaril prn for anxiety as needed  # Alcohol Use Disorder Continue Naltrexone 50 mg daily for goal of reduction in EtOH cravings  Continue lorazepam taper; today 1 mg BID, tomorrow 1 mg QAM, then off Continue Mv, thiamine supplementation Continue monitoring with CIWA   # Nicotine Use Disorder Nicotine Patch as written for w/d symptoms  # Disposition Patient for discharge tomorrow to [facility] Ongoing coordination with SW and team  Aundra Dubin, MD 02/28/2016, 11:46 AM

## 2016-02-28 NOTE — Progress Notes (Signed)
DAR NOTE: Pt present with flat affect and depressed mood in the unit. Pt has been has been in the day room interacting with peers, pt stated she feels better and looking forward [facility]. Pt denies physical pain, took all her meds as scheduled. As per self inventory, pt had a good night sleep, good appetite, low energy, and poor concentration. Pt rate depression at 3, hopeless ness at 3, and anxiety at 8. Pt's safety ensured with 15 minute and environmental checks. Pt currently denies SI/HI and A/V hallucinations. Pt verbally agrees to seek staff if SI/HI or A/VH occurs and to consult with staff before acting on these thoughts. Will continue POC.
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
## 2016-02-28 NOTE — Progress Notes (Signed)
Patient ID: Jean Perry, female   DOB: 1971-01-05, 46 y.o.   MRN: [id number]  Pt currently presents with a flat affect and depressed behavior. Pt reports to writer that their goal is to "be discharged tomorrow." Pt states some worrying about going to "a new place with new people."  Pt reports good sleep with current medication regimen. Interacts positively with peers and staff.  Pt provided with medications per providers orders. Pt's labs and vitals were monitored throughout the night. Pt given a 1:1 about emotional and mental status. Pt supported and encouraged to express concerns and questions. Pt educated on medications.  Pt's safety ensured with 15 minute and environmental checks. Pt currently denies SI/HI and A/V hallucinations. Pt verbally agrees to seek staff if SI/HI or A/VH occurs and to consult with staff before acting on any harmful thoughts. Pt to be discharged tomorrow to [facility]. Will continue POC.

## 2016-02-28 NOTE — Progress Notes (Signed)
  [facility] Adult Case Management Discharge Plan :  Will you be returning to the same living situation after discharge:  No. Pt has screening for possible admission to [facility].  At discharge, do you have transportation home?: Yes,  taxi voucher in chart. Pt must discharge by 11:00AM on Thursday for 1145am interview at [facility] you have the ability to pay for your medications: Yes,  mental health  Release of information consent forms completed and submitted to medical records by CSW.  Patient to Follow up at: Follow-up Information    [facility] Follow up on 02/29/2016.   Why:  at 11:45am for your admission screening. Contact information: [address] [place] [address] [phone]           Next level of care provider has access to [place] and Suicide Prevention discussed: Yes,  SPE completed with pt; pt declined to consent to family contact. SPI pamphlet and Mobile Crisis information provided.  Have you used any form of tobacco in the last 30 days? (Cigarettes, Smokeless Tobacco, Cigars, and/or Pipes): Yes  Has patient been referred to the Quitline?: Patient refused referral  Patient has been referred for addiction treatment: Yes  Akacia Boltz N Smart LCSW 02/28/2016, 12:56 PM

## 2016-02-29 NOTE — Progress Notes (Signed)
D: Jean Perry denied SI, HI, and AVH. She appeared somewhat anxious about discharge and voiced some worries about her son, whom she hopes can get into the [place]. She believes his problems stem from PTSD he developed while deployed in [place]. Jean Perry says she has a plan for how she would deal with any SI post-discharge. We discussed her medications and plans. She took medications without issue. On her self inventory, she reported fair sleep, good appetite, low energy level, and good concentration. She rated her depression 6/10, feelings of hopelessness 6/10, and anxiety 9/10.   A: Meds given as ordered. Q15 safety checks maintained. Support/encouragement offered.  R: Pt remains free from harm and continues with treatment. Will continue to monitor for needs/safety.

## 2016-02-29 NOTE — Progress Notes (Signed)
Patient was discharged per order. AVS, medications, scripts, transition summary, and suicide safety plan were all reviewed with patient. Pt was given an opportunity to ask questions and verbalized understanding of all discharge paperwork. Belongings were returned, and patient signed for receipt. Patient verbalized readiness for discharge and appeared in no acute distress when escorted to awaiting [employer] cab with voucher.

## 2017-08-16 IMAGING — CR DG CHEST 2V
2 series · 2 of 2 positions shown · non-contrast
Comparison: Radiograph 01/25/2014

CLINICAL DATA: Productive cough yellow mucus

EXAM:
CHEST  2 VIEW

[w chest pa]
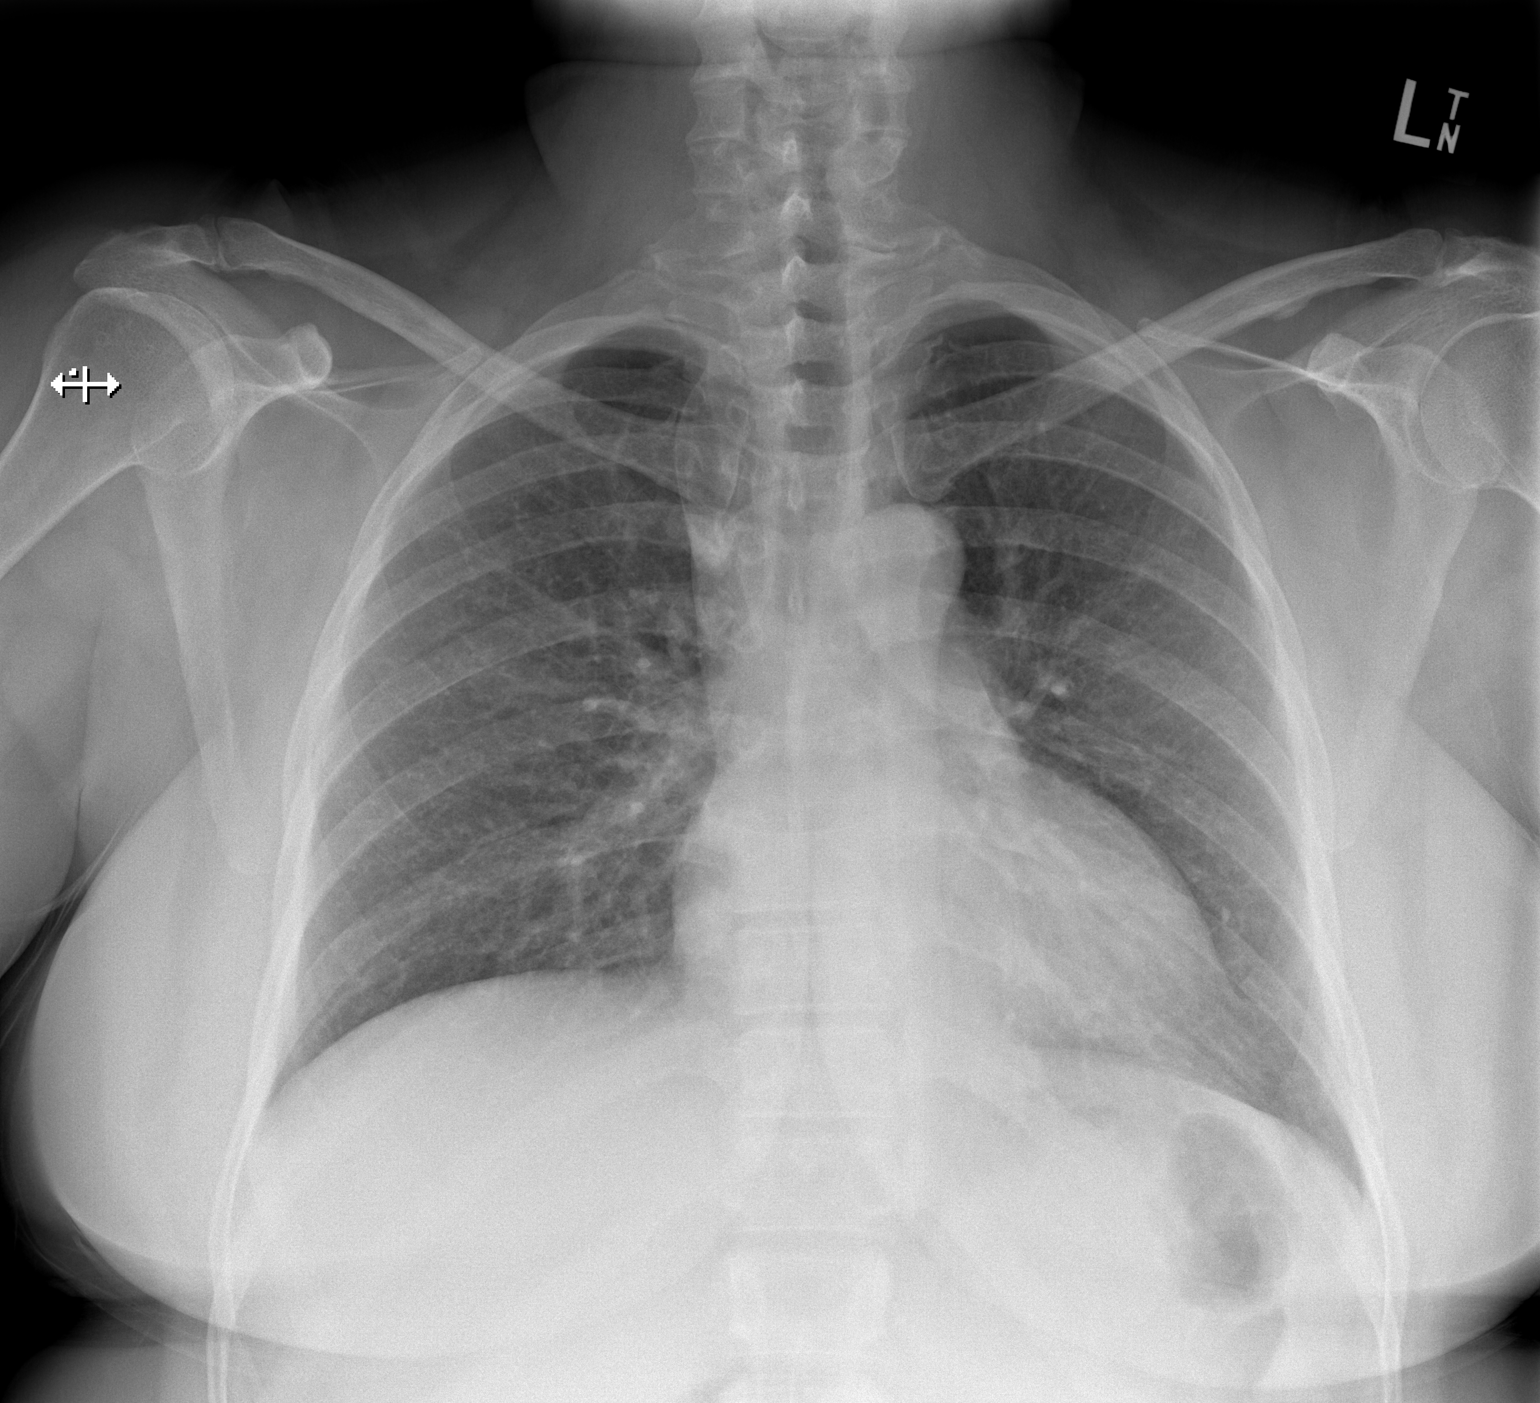

[w chest lat]
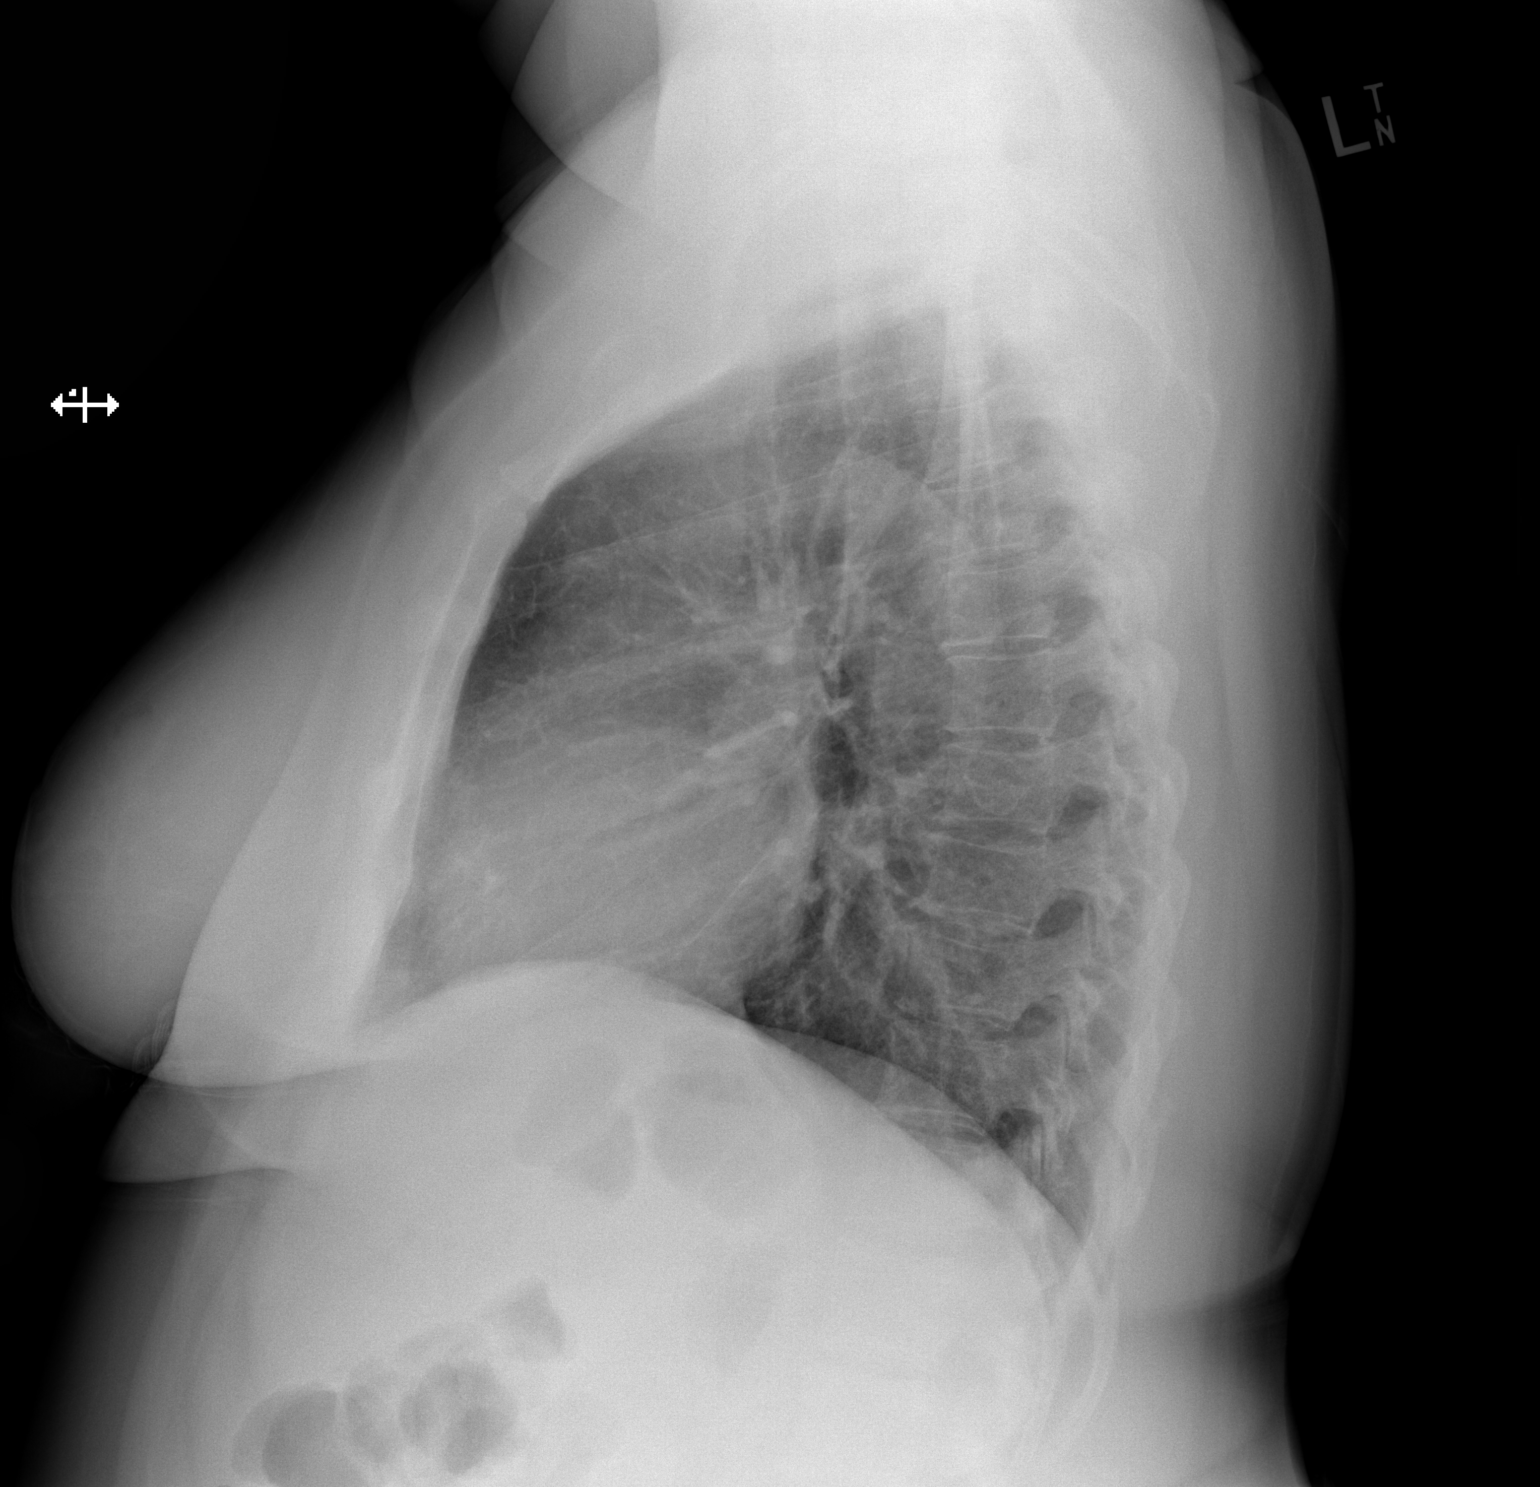

[2 of 2 positions shown; findings below may reference images not displayed]

FINDINGS: Normal mediastinum and cardiac silhouette. Normal pulmonary
vasculature. No evidence of effusion, infiltrate, or pneumothorax.
No acute bony abnormality.
IMPRESSION: No acute cardiopulmonary process.
